# Patient Record
Sex: Male | Born: 1995 | Race: White | Hispanic: No | Marital: Single | Smoking: Never smoker
Health system: Southern US, Community
[De-identification: ages and names within clinical notes are randomized; demographics above are authoritative.]

## PROBLEM LIST (undated history)

## (undated) DIAGNOSIS — K219 Gastro-esophageal reflux disease without esophagitis: Secondary | ICD-10-CM

## (undated) DIAGNOSIS — F32A Depression, unspecified: Secondary | ICD-10-CM

## (undated) DIAGNOSIS — T7840XA Allergy, unspecified, initial encounter: Secondary | ICD-10-CM

## (undated) DIAGNOSIS — F419 Anxiety disorder, unspecified: Secondary | ICD-10-CM

## (undated) DIAGNOSIS — R6250 Unspecified lack of expected normal physiological development in childhood: Secondary | ICD-10-CM

## (undated) HISTORY — DX: Depression, unspecified: F32.A

## (undated) HISTORY — DX: Allergy, unspecified, initial encounter: T78.40XA

## (undated) HISTORY — DX: Anxiety disorder, unspecified: F41.9

---
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
## 2018-08-31 ENCOUNTER — Other Ambulatory Visit: Payer: Self-pay

## 2018-08-31 ENCOUNTER — Encounter (HOSPITAL_COMMUNITY): Payer: Self-pay | Admitting: Emergency Medicine

## 2018-08-31 ENCOUNTER — Emergency Department (HOSPITAL_COMMUNITY): Payer: No Typology Code available for payment source

## 2018-08-31 ENCOUNTER — Emergency Department (HOSPITAL_COMMUNITY)
Admission: EM | Admit: 2018-08-31 | Discharge: 2018-08-31 | Disposition: A | Discharge status: Home / Self Care | Payer: No Typology Code available for payment source | Attending: Emergency Medicine | Admitting: Emergency Medicine

## 2018-08-31 DIAGNOSIS — Y939 Activity, unspecified: Secondary | ICD-10-CM | POA: Insufficient documentation

## 2018-08-31 DIAGNOSIS — T148XXA Other injury of unspecified body region, initial encounter: Secondary | ICD-10-CM | POA: Diagnosis not present

## 2018-08-31 DIAGNOSIS — Y92410 Unspecified street and highway as the place of occurrence of the external cause: Secondary | ICD-10-CM | POA: Diagnosis not present

## 2018-08-31 DIAGNOSIS — Z79899 Other long term (current) drug therapy: Secondary | ICD-10-CM | POA: Insufficient documentation

## 2018-08-31 DIAGNOSIS — T07XXXA Unspecified multiple injuries, initial encounter: Secondary | ICD-10-CM

## 2018-08-31 DIAGNOSIS — S199XXA Unspecified injury of neck, initial encounter: Secondary | ICD-10-CM | POA: Diagnosis present

## 2018-08-31 DIAGNOSIS — S161XXA Strain of muscle, fascia and tendon at neck level, initial encounter: Secondary | ICD-10-CM | POA: Insufficient documentation

## 2018-08-31 DIAGNOSIS — Y999 Unspecified external cause status: Secondary | ICD-10-CM | POA: Insufficient documentation

## 2018-08-31 HISTORY — DX: Gastro-esophageal reflux disease without esophagitis: K21.9

## 2018-08-31 MED ORDER — CYCLOBENZAPRINE HCL 10 MG PO TABS: 10.0000 mg | ORAL_TABLET | Freq: Three times a day (TID) | ORAL | 0 refills | Status: DC

## 2018-08-31 MED ORDER — TRAMADOL HCL 50 MG PO TABS: ORAL_TABLET | ORAL | 0 refills | Status: DC

## 2018-08-31 MED ORDER — ONDANSETRON HCL 4 MG PO TABS
4.0000 mg | ORAL_TABLET | Freq: Once | ORAL | Status: AC
  Administered 2018-08-31: 4 mg via ORAL
  Filled 2018-08-31: qty 1

## 2018-08-31 MED ORDER — TRAMADOL HCL 50 MG PO TABS
100.0000 mg | ORAL_TABLET | Freq: Once | ORAL | Status: AC
  Administered 2018-08-31: 100 mg via ORAL
  Filled 2018-08-31: qty 2

## 2018-08-31 MED ORDER — METHOCARBAMOL 500 MG PO TABS
500.0000 mg | ORAL_TABLET | Freq: Once | ORAL | Status: AC
  Administered 2018-08-31: 500 mg via ORAL
  Filled 2018-08-31: qty 1
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
## 2018-08-31 NOTE — ED Notes (Signed)
Patient transported to CT 

## 2018-08-31 NOTE — Discharge Instructions (Addendum)
Your vital signs are within normal limits.  Your oxygen level is 100% on room air.  The CT scan of your neck is negative for fracture or dislocation.  The x-rays of your shoulder and knee are negative for fracture, dislocation, or effusion/fluid in the joint.  Please use Tylenol every 4 hours, or ibuprofen every 6 hours for pain and discomfort.  Use Flexeril 3 times daily for spasm pain.  Use Ultram for more severe pain, or pain not improved by Tylenol or ibuprofen.  Flexeril and Ultram may cause drowsiness, please do not drive a vehicle, operate machinery, drink alcohol, and legal documents, or participate in activities requiring concentration when taking either of these medications.  Please see Dr. Ermalinda MemosBradshaw for additional evaluation and management if not improving.

## 2018-10-13 ENCOUNTER — Ambulatory Visit (INDEPENDENT_AMBULATORY_CARE_PROVIDER_SITE_OTHER): Payer: Self-pay | Admitting: Family Medicine

## 2018-10-13 ENCOUNTER — Encounter: Payer: Self-pay | Admitting: Family Medicine

## 2018-10-13 VITALS — BP 133/83 | HR 95 | Temp 97.8°F | Ht 73.0 in | Wt 187.6 lb

## 2018-10-13 DIAGNOSIS — S134XXA Sprain of ligaments of cervical spine, initial encounter: Secondary | ICD-10-CM

## 2018-10-13 DIAGNOSIS — S46911A Strain of unspecified muscle, fascia and tendon at shoulder and upper arm level, right arm, initial encounter: Secondary | ICD-10-CM
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
## 2018-10-21 ENCOUNTER — Ambulatory Visit: Payer: Self-pay | Attending: Family Medicine | Admitting: Physical Therapy

## 2018-10-21 DIAGNOSIS — M25511 Pain in right shoulder: Secondary | ICD-10-CM | POA: Insufficient documentation

## 2018-10-21 DIAGNOSIS — R293 Abnormal posture: Secondary | ICD-10-CM | POA: Insufficient documentation
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
## 2018-10-25 ENCOUNTER — Ambulatory Visit: Payer: Self-pay | Admitting: Physical Therapy

## 2018-10-25 ENCOUNTER — Encounter: Payer: Self-pay | Admitting: Physical Therapy

## 2018-10-25 DIAGNOSIS — M25511 Pain in right shoulder: Secondary | ICD-10-CM

## 2018-10-25 DIAGNOSIS — R293 Abnormal posture: Secondary | ICD-10-CM

## 2018-10-25 NOTE — Therapy (Signed)
[facility] [address] [place], [place], [address] Phone: [phone]   Fax:  [phone]  Physical Therapy Treatment  Patient Details  Name: Joshua Hurst MRN: [id number] Date of Birth: 02-27-1996 Referring Provider (PT): Arville Care MD.   Encounter Date: 10/25/2018  PT End of Session - 10/25/18 1120    Visit Number  2    Number of Visits  12    Date for PT Re-Evaluation  12/02/18    PT Start Time  1120    PT Stop Time  1205    PT Time Calculation (min)  45 min    Activity Tolerance  Patient tolerated treatment well    Behavior During Therapy  [facility] for tasks assessed/performed       Past Medical History:  Diagnosis Date  . Acid reflux   . Allergy    Spring allergies    History reviewed. No pertinent surgical history.  There were no vitals filed for this visit.  Subjective Assessment - 10/25/18 1119    Subjective  Reports continued pain.    Patient Stated Goals  Get out of pain.    Currently in Pain?  Yes    Pain Score  8     Pain Location  Shoulder    Pain Orientation  Right    Pain Descriptors / Indicators  Aching    Pain Type  Acute pain    Pain Onset  More than a month ago    Pain Frequency  Constant         [facility] PT Assessment - 10/25/18 0001      Assessment   Medical Diagnosis  Right shoulder strain.    Referring Provider (PT)  Ivin Booty Dettinger MD.    Onset Date/Surgical Date  09/01/18    Next MD Visit  Unsure      Precautions   Precautions  None      Restrictions   Weight Bearing Restrictions  No                   [facility] Adult PT Treatment/Exercise - 10/25/18 0001      Modalities   Modalities  Electrical Stimulation;Ultrasound;Vasopneumatic      Electrical Stimulation   Electrical Stimulation Location  R shoulder    Electrical Stimulation Action  Pre-Mod    Electrical Stimulation Parameters  80-150 hz x15 min    Electrical Stimulation Goals  Pain      Ultrasound    Ultrasound Location  R shoulder    Ultrasound Parameters  Combo 1.2 w/cm2, 100%, 1 mhz x10 min    Ultrasound Goals  Pain      Vasopneumatic   Number Minutes Vasopneumatic   15 minutes    Vasopnuematic Location   Shoulder    Vasopneumatic Pressure  Low    Vasopneumatic Temperature   51      Manual Therapy   Manual Therapy  Myofascial release;Passive ROM    Myofascial Release  IASTW to R proximal bicep, deltoids to reduce adhesions, muscle restrictions    Passive ROM  PROM of R shoulder into flex, abduction, ER, IR with gentle holds at end range                  PT Long Term Goals - 10/25/18 1241      PT LONG TERM GOAL #1   Title  Independent with a HEP.    Time  6    Period  Weeks  Status  New      PT LONG TERM GOAL #2   Title  Perform ADL's with pain not > 2/10.    Time  6    Period  Weeks    Status  New            Plan - 10/25/18 1203    Clinical Impression Statement  Patient presented in clinic with increased R shoulder pain that is reported as constant in both posterior and anterior shoulder. Normal modalities response noted following end of the modalities session. IASTW completed to R shoulder lightly with good redness response. PROM of R shoulder completed as well with greater resistance into IR and ER. Patient reported pain in B hands during PROM session. VCs required throughout PROM session for relaxation of RUE. Patient instructed that following IASTW he may be very sore and that ice could be utilized for 15-20 minutes at a time to assist with the discomfort.     Rehab Potential  Excellent    PT Frequency  2x / week    PT Duration  6 weeks    PT Treatment/Interventions  ADLs/Self Care Home Management;Cryotherapy;Electrical Stimulation;Ultrasound;Moist Heat;Therapeutic activities;Therapeutic exercise;Patient/family education;Manual techniques;Vasopneumatic Device    PT Next Visit Plan  Combo e'stim/U/S to right anterior shoulder, HMP, e'stim and  vasopneumatic.    Consulted and Agree with Plan of Care  Patient       Patient will benefit from skilled therapeutic intervention in order to improve the following deficits and impairments:  Pain  Visit Diagnosis: Acute pain of right shoulder  Abnormal posture     Problem List Patient Active Problem List   Diagnosis Date Noted  . Paronychia of great toe, right 02/19/2013  . Nail, ingrown 02/19/2013    APPLEGATE, [place], PTA 10/25/2018, 12:42 PM  [facility] [address] [place], [place], [address] Phone: [phone]   Fax:  [phone]  Name: Joshua Hurst MRN: [id number] Date of Birth: April 02, 1996

## 2018-10-27 ENCOUNTER — Ambulatory Visit: Payer: Self-pay | Admitting: Physical Therapy

## 2018-10-27 ENCOUNTER — Encounter: Payer: Self-pay | Admitting: Physical Therapy

## 2018-10-27 DIAGNOSIS — M25511 Pain in right shoulder: Secondary | ICD-10-CM

## 2018-10-27 DIAGNOSIS — R293 Abnormal posture: Secondary | ICD-10-CM

## 2018-10-27 NOTE — Therapy (Signed)
[facility] [address] [place], [place], [address] Phone: [phone]   Fax:  [phone]  Physical Therapy Treatment  Patient Details  Name: Joshua Hurst MRN: [id number] Date of Birth: 07-17-1996 Referring Provider (PT): Arville Care MD.   Encounter Date: 10/27/2018  PT End of Session - 10/27/18 1154    Visit Number  3    Number of Visits  12    Date for PT Re-Evaluation  12/02/18    PT Start Time  1115    PT Stop Time  1205    PT Time Calculation (min)  50 min    Activity Tolerance  Patient tolerated treatment well    Behavior During Therapy  [facility] for tasks assessed/performed       Past Medical History:  Diagnosis Date  . Acid reflux   . Allergy    Spring allergies    History reviewed. No pertinent surgical history.  There were no vitals filed for this visit.      [facility] PT Assessment - 10/27/18 0001      Assessment   Medical Diagnosis  Right shoulder strain.    Referring Provider (PT)  Ivin Booty Dettinger MD.    Onset Date/Surgical Date  09/01/18                   [facility] Adult PT Treatment/Exercise - 10/27/18 0001      Modalities   Modalities  Electrical Stimulation;Ultrasound;Vasopneumatic      Electrical Stimulation   Electrical Stimulation Location  R shoulder    Electrical Stimulation Action  Pre-mod    Electrical Stimulation Parameters  80-150 hz x15 min    Electrical Stimulation Goals  Pain      Ultrasound   Ultrasound Location  R shoulder    Ultrasound Parameters  combo 1.2 w/cm2, 100%, x12 mins    Ultrasound Goals  Pain      Vasopneumatic   Number Minutes Vasopneumatic   15 minutes    Vasopnuematic Location   Shoulder    Vasopneumatic Pressure  Low    Vasopneumatic Temperature   54      Manual Therapy   Manual Therapy  Myofascial release;Passive ROM;Joint mobilization    Joint Mobilization  Gentle grade I Posterior mobs    Myofascial Release  IASTW to R proximal bicep,  deltoids to reduce adhesions, muscle restrictions    Passive ROM  PROM of R shoulder into flex, abduction, ER, IR with gentle holds at end range                  PT Long Term Goals - 10/25/18 1241      PT LONG TERM GOAL #1   Title  Independent with a HEP.    Time  6    Period  Weeks    Status  New      PT LONG TERM GOAL #2   Title  Perform ADL's with pain not > 2/10.    Time  6    Period  Weeks    Status  New            Plan - 10/27/18 1154    Clinical Impression Statement  Patient was able to tolerate treatment fairly well but did report right hand/wrist numbness during PROM. Patient very guarded with PROM despite asking to relax arm and allow therapist to move it without help and with oscillations to promote muscle relaxation. Patient noted with rounded shoulders and forward head in sitting.  Normal response to modalities upon removal.     Clinical Presentation  Stable    Clinical Decision Making  Low    Rehab Potential  Excellent    PT Frequency  2x / week    PT Duration  6 weeks    PT Treatment/Interventions  ADLs/Self Care Home Management;Cryotherapy;Electrical Stimulation;Ultrasound;Moist Heat;Therapeutic activities;Therapeutic exercise;Patient/family education;Manual techniques;Vasopneumatic Device    PT Next Visit Plan  Combo e'stim/U/S to right anterior shoulder, HMP, e'stim and vasopneumatic.    Consulted and Agree with Plan of Care  Patient       Patient will benefit from skilled therapeutic intervention in order to improve the following deficits and impairments:  Pain  Visit Diagnosis: Acute pain of right shoulder  Abnormal posture     Problem List Patient Active Problem List   Diagnosis Date Noted  . Paronychia of great toe, right 02/19/2013  . Nail, ingrown 02/19/2013    Guss BundeKrystle Christiano Blandon, PT, DPT 10/27/2018, 12:07 PM  [facility] [address] [place], [place], [address] Phone: [phone]    Fax:  [phone]  Name: Joshua Hurst MRN: [id number] Date of Birth: December 21, 1995

## 2018-11-01 ENCOUNTER — Ambulatory Visit: Payer: Self-pay | Admitting: Physical Therapy

## 2018-11-01 DIAGNOSIS — R293 Abnormal posture: Secondary | ICD-10-CM

## 2018-11-01 DIAGNOSIS — M25511 Pain in right shoulder: Secondary | ICD-10-CM

## 2018-11-01 NOTE — Therapy (Signed)
[facility] [address] [place], [place], [address] Phone: [phone]   Fax:  [phone]  Physical Therapy Treatment  Patient Details  Name: Joshua Hurst MRN: [id number] Date of Birth: [birth year] Referring Provider (PT): Arville Care MD.   Encounter Date: 11/01/2018  PT End of Session - 11/01/18 1121    Visit Number  4    Number of Visits  12    Date for PT Re-Evaluation  12/02/18    PT Start Time  1118    PT Stop Time  1203    PT Time Calculation (min)  45 min    Activity Tolerance  Patient tolerated treatment well    Behavior During Therapy  [facility] for tasks assessed/performed       Past Medical History:  Diagnosis Date  . Acid reflux   . Allergy    Spring allergies    No past surgical history on file.  There were no vitals filed for this visit.  Subjective Assessment - 11/01/18 1121    Subjective  Reports continued pain.    Patient Stated Goals  Get out of pain.    Currently in Pain?  Yes    Pain Score  8     Pain Location  Shoulder    Pain Orientation  Right    Pain Descriptors / Indicators  Discomfort    Pain Type  Acute pain    Pain Onset  More than a month ago         [facility] PT Assessment - 11/01/18 0001      Assessment   Medical Diagnosis  Right shoulder strain.    Referring Provider (PT)  Ivin Booty Dettinger MD.    Onset Date/Surgical Date  09/01/18                   [facility] Adult PT Treatment/Exercise - 11/01/18 0001      Exercises   Exercises  Shoulder      Shoulder Exercises: Pulleys   Flexion  5 minutes      Shoulder Exercises: ROM/Strengthening   UBE (Upper Arm Bike)  120 RPM x6 min      Modalities   Modalities  Electrical Stimulation;Ultrasound;Vasopneumatic      Electrical Stimulation   Electrical Stimulation Location  R shoulder    Electrical Stimulation Action  Pre-Mod    Electrical Stimulation Parameters  80-150 hz x15 min    Electrical Stimulation Goals  Pain      Ultrasound   Ultrasound Location  R shoulder    Ultrasound Parameters  Combo 1.5 w/cm2, 100%, 1 mhz x10 min    Ultrasound Goals  Pain      Vasopneumatic   Number Minutes Vasopneumatic   15 minutes    Vasopnuematic Location   Shoulder    Vasopneumatic Pressure  Low    Vasopneumatic Temperature   34      Manual Therapy   Manual Therapy  Passive ROM    Passive ROM  PROM of R shoulder into flex, ER, IR with gentle holds at end range                  PT Long Term Goals - 10/25/18 1241      PT LONG TERM GOAL #1   Title  Independent with a HEP.    Time  6    Period  Weeks    Status  New      PT LONG TERM GOAL #2  Title  Perform ADL's with pain not > 2/10.    Time  6    Period  Weeks    Status  New            Plan - 11/01/18 1155    Clinical Impression Statement  Patient presented in clinic with continued reports of high level R shoulder pain particularly in anterior R shoulder. Patient introduced to more AAROM exercises in sitting in which patient completed in slow speed and completed only partial ROM with pulleys. Combo US completed as well with normal response. Full PROM of R shoulder into flexion and ER with muscle guarding noted during flexion and IR. Intermittant popping of R shoulder noted during PROM into flexion in which patient reported with discomfort. Firm end feels and smooth arc of motion noted durin PROM of R shoulder othan than times with muscle guarding in which limited PROM. Normal modalities response noted following removal of the modalities.    Rehab Potential  Excellent    PT Frequency  2x / week    PT Duration  6 weeks    PT Treatment/Interventions  ADLs/Self Care Home Management;Cryotherapy;Electrical Stimulation;Ultrasound;Moist Heat;Therapeutic activities;Therapeutic exercise;Patient/family education;Manual techniques;Vasopneumatic Device    PT Next Visit Plan  Combo e'stim/U/S to right anterior shoulder, HMP, e'stim and vasopneumatic.     Consulted and Agree with Plan of Care  Patient       Patient will benefit from skilled therapeutic intervention in order to improve the following deficits and impairments:  Pain  Visit Diagnosis: Acute pain of right shoulder  Abnormal posture     Problem List Patient Active Problem List   Diagnosis Date Noted  . Paronychia of great toe, right 02/19/2013  . Nail, ingrown 02/19/2013    Marvell FullerKelsey P Quanda Pavlicek, PTA 11/01/2018, 12:05 PM  [facility] [address] [place], [place], [address] Phone: [phone]   Fax:  [phone]  Name: Joshua Hurst MRN: [id number] Date of Birth: [birth year]

## 2018-11-03 ENCOUNTER — Encounter: Payer: Self-pay | Admitting: Physical Therapy

## 2018-11-03 ENCOUNTER — Ambulatory Visit: Payer: Self-pay | Admitting: Physical Therapy

## 2018-11-03 DIAGNOSIS — M25511 Pain in right shoulder: Secondary | ICD-10-CM

## 2018-11-03 DIAGNOSIS — R293 Abnormal posture: Secondary | ICD-10-CM

## 2018-11-03 NOTE — Therapy (Signed)
[facility] [address] [place], [place], [address] Phone: [phone]   Fax:  [phone]  Physical Therapy Treatment  Patient Details  Name: Joshua Hurst MRN: [id number] Date of Birth: [birth year] Referring Provider (PT): Arville Care MD.   Encounter Date: 11/03/2018  PT End of Session - 11/03/18 1121    Visit Number  5    Number of Visits  12    Date for PT Re-Evaluation  12/02/18    PT Start Time  1120    PT Stop Time  1159    PT Time Calculation (min)  39 min    Activity Tolerance  Patient tolerated treatment well    Behavior During Therapy  [facility] for tasks assessed/performed       Past Medical History:  Diagnosis Date  . Acid reflux   . Allergy    Spring allergies    History reviewed. No pertinent surgical history.  There were no vitals filed for this visit.  Subjective Assessment - 11/03/18 1121    Subjective  Reports continued high level pain in R shoulder since starting exercises last treatment.    Patient Stated Goals  Get out of pain.    Currently in Pain?  Yes    Pain Score  9     Pain Location  Shoulder    Pain Orientation  Right    Pain Descriptors / Indicators  Discomfort    Pain Type  Acute pain    Pain Onset  More than a month ago    Pain Frequency  Constant         [facility] PT Assessment - 11/03/18 0001      Assessment   Medical Diagnosis  Right shoulder strain.    Referring Provider (PT)  Ivin Booty Dettinger MD.    Onset Date/Surgical Date  09/01/18    Next MD Visit  Unsure      Precautions   Precautions  None      Restrictions   Weight Bearing Restrictions  No                   [facility] Adult PT Treatment/Exercise - 11/03/18 0001      Shoulder Exercises: ROM/Strengthening   UBE (Upper Arm Bike)  120 RPM x6 min   increased pain     Modalities   Modalities  Electrical Stimulation;Ultrasound;Vasopneumatic      Electrical Stimulation   Electrical Stimulation Location  R  shoulder    Electrical Stimulation Action  Pre-Mod    Electrical Stimulation Parameters  80-150 hz x15 min    Electrical Stimulation Goals  Pain      Ultrasound   Ultrasound Location  R proximal shoulder    Ultrasound Parameters  1.5 w/cm2, 100%, x10 min    Ultrasound Goals  Pain      Vasopneumatic   Number Minutes Vasopneumatic   15 minutes    Vasopnuematic Location   Shoulder    Vasopneumatic Pressure  Low    Vasopneumatic Temperature   52      Manual Therapy   Manual Therapy  Passive ROM    Passive ROM  PROM of R shoulder into flex, ER, IR with gentle holds at end range                  PT Long Term Goals - 10/25/18 1241      PT LONG TERM GOAL #1   Title  Independent with a HEP.  Time  6    Period  Weeks    Status  New      PT LONG TERM GOAL #2   Title  Perform ADL's with pain not > 2/10.    Time  6    Period  Weeks    Status  New            Plan - 11/03/18 1204    Clinical Impression Statement  Patient presented in clinic with continued reports of high level R shoulder pain. Patient completed UBE at very slow pace and observed holding his R shoulder afterwards. Normal modalities response noted following removal of the modalities. Muscle guarding in R deltoids noted. Firm end feels detected in R shoulder flexion or ER. No end feel detected in R shoulder IR due to muscle resistance. One instance of R shoulder popping detected with PROM in supine.    Rehab Potential  Excellent    PT Frequency  2x / week    PT Duration  6 weeks    PT Treatment/Interventions  ADLs/Self Care Home Management;Cryotherapy;Electrical Stimulation;Ultrasound;Moist Heat;Therapeutic activities;Therapeutic exercise;Patient/family education;Manual techniques;Vasopneumatic Device    PT Next Visit Plan  Combo e'stim/U/S to right anterior shoulder, HMP, e'stim and vasopneumatic.    Consulted and Agree with Plan of Care  Patient       Patient will benefit from skilled  therapeutic intervention in order to improve the following deficits and impairments:  Pain  Visit Diagnosis: Acute pain of right shoulder  Abnormal posture     Problem List Patient Active Problem List   Diagnosis Date Noted  . Paronychia of great toe, right 02/19/2013  . Nail, ingrown 02/19/2013    Marvell Fuller, PTA 11/03/2018, 12:10 PM  [facility] [address] [place], [place], [address] Phone: [phone]   Fax:  [phone]  Name: Joshua Hurst MRN: [id number] Date of Birth: 1995-12-12

## 2018-11-08 ENCOUNTER — Encounter: Payer: Self-pay | Admitting: Physical Therapy

## 2018-11-08 ENCOUNTER — Ambulatory Visit: Payer: Self-pay | Attending: Family Medicine | Admitting: Physical Therapy

## 2018-11-08 DIAGNOSIS — M25511 Pain in right shoulder: Secondary | ICD-10-CM | POA: Insufficient documentation

## 2018-11-08 DIAGNOSIS — R293 Abnormal posture: Secondary | ICD-10-CM | POA: Insufficient documentation

## 2018-11-08 NOTE — Therapy (Signed)
[facility] [address] [place], [place], [address] Phone: [phone]   Fax:  [phone]  Physical Therapy Treatment  Patient Details  Name: Joshua Hurst MRN: [id number] Date of Birth: 1996-07-05 Referring Provider (PT): Arville CareJoshua Dettinger MD.   Encounter Date: 11/08/2018  PT End of Session - 11/08/18 1515    Visit Number  6    Number of Visits  12    Date for PT Re-Evaluation  12/02/18    PT Start Time  1430    PT Stop Time  1517    PT Time Calculation (min)  47 min    Activity Tolerance  Patient tolerated treatment well    Behavior During Therapy  [facility]       Past Medical History:  Diagnosis Date  . Acid reflux   . Allergy    Spring allergies    History reviewed. No pertinent surgical history.  There were no vitals filed for this visit.  Subjective Assessment - 11/08/18 1511    Subjective  Patient reported ongoing 8/10 right shoulder pain    Patient Stated Goals  Get out of pain.    Currently in Pain?  Yes    Pain Score  8     Pain Orientation  Right    Pain Descriptors / Indicators  Sore    Pain Type  Acute pain    Pain Onset  More than a month ago    Pain Frequency  Constant         [facility] PT Assessment - 11/08/18 0001      Assessment   Medical Diagnosis  Right shoulder strain.    Referring Provider (PT)  Ivin BootyJoshua Dettinger MD.    Onset Date/Surgical Date  09/01/18    Next MD Visit  Jaci LazierUnsure                   [facility] Adult PT Treatment/Exercise - 11/08/18 0001      Shoulder Exercises: ROM/Strengthening   UBE (Upper Arm Bike)  120 RPM x8 min      Shoulder Exercises: Stretch   Corner Stretch  2 reps;30 seconds    Other Shoulder Stretches  right UT stretch 2x30 seconds      Modalities   Modalities  Music therapistlectrical Stimulation;Ultrasound;Vasopneumatic      Electrical Stimulation   Electrical Stimulation Location  R shoulder    Electrical Stimulation Action  pre-mod    Electrical Stimulation Parameters  80-150 hz x15 mins    Electrical Stimulation Goals  Pain      Ultrasound   Ultrasound Location  R UT    Ultrasound Parameters  1.5w/cm2, 100%, 1mhz x12 mins    Ultrasound Goals  Pain      Vasopneumatic   Number Minutes Vasopneumatic   15 minutes    Vasopnuematic Location   Shoulder    Vasopneumatic Pressure  Low    Vasopneumatic Temperature   34                  PT Long Term Goals - 10/25/18 1241      PT LONG TERM GOAL #1   Title  Independent with a HEP.    Time  6    Period  Weeks    Status  New      PT LONG TERM GOAL #2   Title  Perform ADL's with pain not > 2/10.    Time  6    Period  Weeks  Status  New            Plan - 11/08/18 1515    Clinical Impression Statement  Patient was able to complete but patient noted with performing UBE at a very slow pace. Patient reported of complaints of left shoulder pain as well after UBE. Patient was able to perform UT stretch and corner stretch with minimal complaints. When applying e-stim patient reported feeling the e-stim and stated intensity was strong enough but when rechecking modalities, it was noted that e-stim was applied to the wrong channel. Patient asked again if he felt sensation and he replied no. E-stim was readjusted to which patient reported feeling sensation.    Clinical Presentation  Stable    Clinical Decision Making  Low    Rehab Potential  Excellent    PT Frequency  2x / week    PT Duration  6 weeks    PT Treatment/Interventions  ADLs/Self Care Home Management;Cryotherapy;Electrical Stimulation;Ultrasound;Moist Heat;Therapeutic activities;Therapeutic exercise;Patient/family education;Manual techniques;Vasopneumatic Device    PT Next Visit Plan  Combo e'stim/U/S to right anterior shoulder, HMP, e'stim and vasopneumatic.    Consulted and Agree with Plan of Care  Patient       Patient will benefit from skilled therapeutic intervention in order to improve the  following deficits and impairments:  Pain  Visit Diagnosis: Acute pain of right shoulder  Abnormal posture     Problem List Patient Active Problem List   Diagnosis Date Noted  . Paronychia of great toe, right 02/19/2013  . Nail, ingrown 02/19/2013    Guss BundeKrystle Dejanee Thibeaux, PT, DPT 11/08/2018, 3:29 PM  [facility] [address] [place], [place], [address] Phone: [phone]   Fax:  [phone]  Name: Joshua Hurst MRN: [id number] Date of Birth: 28-Aug-1996

## 2018-11-11 ENCOUNTER — Ambulatory Visit: Payer: Self-pay | Admitting: Physical Therapy

## 2018-11-11 ENCOUNTER — Encounter: Payer: Self-pay | Admitting: Physical Therapy

## 2018-11-11 DIAGNOSIS — R293 Abnormal posture: Secondary | ICD-10-CM

## 2018-11-11 DIAGNOSIS — M25511 Pain in right shoulder: Secondary | ICD-10-CM

## 2018-11-11 NOTE — Therapy (Signed)
[facility] [place], [place], [address] Phone: [phone]   Fax:  [phone]  Physical Therapy Treatment  Patient Details  Name: Joshua Hurst MRN: [id number] Date of Birth: [birth year] Referring Provider (PT): Caryl Pina MD.   Encounter Date: 11/11/2018  PT End of Session - 11/11/18 1118    Visit Number  7    Number of Visits  12    Date for PT Re-Evaluation  12/02/18    PT Start Time  1116    PT Stop Time  1201    PT Time Calculation (min)  45 min    Activity Tolerance  Patient tolerated treatment well    Behavior During Therapy  [facility] for tasks assessed/performed       Past Medical History:  Diagnosis Date  . Acid reflux   . Allergy    Spring allergies    History reviewed. No pertinent surgical history.  There were no vitals filed for this visit.  Subjective Assessment - 11/11/18 1118    Subjective  Patient reported ongoing 8/10 right shoulder pain    Patient Stated Goals  Get out of pain.    Currently in Pain?  Yes    Pain Score  8     Pain Location  Shoulder    Pain Orientation  Right    Pain Descriptors / Indicators  Discomfort    Pain Type  Acute pain    Pain Onset  More than a month ago         [facility] PT Assessment - 11/11/18 0001      Assessment   Medical Diagnosis  Right shoulder strain.    Referring Provider (PT)  Vonna Kotyk Dettinger MD.    Onset Date/Surgical Date  09/01/18    Next MD Visit  Unsure      ROM / Strength   AROM / PROM / Strength  AROM;PROM      AROM   Overall AROM   Deficits    AROM Assessment Site  Shoulder    Right/Left Shoulder  Right    Right Shoulder Flexion  118 Degrees    Right Shoulder Internal Rotation  60 Degrees    Right Shoulder External Rotation  58 Degrees      PROM   Overall PROM   Deficits    PROM Assessment Site  Shoulder    Right/Left Shoulder  Right    Right Shoulder Flexion  122 Degrees    Right Shoulder Internal Rotation  58 Degrees    Right Shoulder External Rotation  58 Degrees                   [facility] Adult PT Treatment/Exercise - 11/11/18 0001      Shoulder Exercises: Seated   Other Seated Exercises  UE ranger into flex x3 min, circles x3 min      Shoulder Exercises: Pulleys   Flexion  3 minutes      Modalities   Modalities  Electrical Stimulation;Ultrasound;Vasopneumatic      Electrical Stimulation   Electrical Stimulation Location  R shoulder    Electrical Stimulation Action  Pre-Mod    Electrical Stimulation Parameters  80-150 hz x10 min    Electrical Stimulation Goals  Pain      Ultrasound   Ultrasound Location  R UT    Ultrasound Parameters  1.5 w/cm2, 100%, 1 mhz x10 min    Ultrasound Goals  Pain      Vasopneumatic   Number  Minutes Vasopneumatic   10 minutes    Vasopnuematic Location   Shoulder    Vasopneumatic Pressure  Low    Vasopneumatic Temperature   34                  PT Long Term Goals - 11/11/18 1210      PT LONG TERM GOAL #1   Title  Independent with a HEP.    Time  6    Period  Weeks    Status  Unable to assess      PT LONG TERM GOAL #2   Title  Perform ADL's with pain not > 2/10.    Time  6    Period  Weeks    Status  Not Met            Plan - 11/11/18 1157    Clinical Impression Statement  Patient presented in clinic with continued reports of 8/10 R shoulder. Patient advised that since PT has not been improving pain and symptoms to return to MD for further assessment. Patient indicated more AC joint pain as well as pain into R UT. TIghtness palpable in superior fibers of triangle of the neck as well as posterior fibers. AROM and PROM measurements listed in today note with intermittant muscle resistance noted with shoulder IR PROM and flexion. Normal modalities response noted following removal of the modalities.    Rehab Potential  Excellent    PT Frequency  2x / week    PT Duration  6 weeks    PT Treatment/Interventions  ADLs/Self Care Home  Management;Cryotherapy;Electrical Stimulation;Ultrasound;Moist Heat;Therapeutic activities;Therapeutic exercise;Patient/family education;Manual techniques;Vasopneumatic Device    PT Next Visit Plan  Return to MD for further assessment.    Consulted and Agree with Plan of Care  Patient       Patient will benefit from skilled therapeutic intervention in order to improve the following deficits and impairments:  Pain  Visit Diagnosis: Acute pain of right shoulder  Abnormal posture     Problem List Patient Active Problem List   Diagnosis Date Noted  . Paronychia of great toe, right 02/19/2013  . Nail, ingrown 02/19/2013   Standley Brooking, PTA 11/11/18 12:11 PM   [facility] [place], [place], [address] Phone: [phone]   Fax:  [phone]  Name: Joshua Hurst MRN: [id number] Date of Birth: [birth year]

## 2018-11-17 ENCOUNTER — Encounter: Payer: Medicaid Other | Admitting: Physical Therapy

## 2018-11-19 ENCOUNTER — Encounter: Payer: Self-pay | Admitting: Family Medicine

## 2018-11-19 ENCOUNTER — Ambulatory Visit (INDEPENDENT_AMBULATORY_CARE_PROVIDER_SITE_OTHER): Payer: Self-pay | Admitting: Family Medicine

## 2018-11-19 ENCOUNTER — Encounter: Payer: Medicaid Other | Admitting: Physical Therapy

## 2018-11-19 VITALS — BP 118/76 | HR 68 | Temp 97.8°F | Ht 73.0 in | Wt 187.4 lb

## 2018-11-19 DIAGNOSIS — S46911A Strain of unspecified muscle, fascia and tendon at shoulder and upper arm level, right arm, initial encounter: Secondary | ICD-10-CM

## 2018-11-19 DIAGNOSIS — S46911D Strain of unspecified muscle, fascia and tendon at shoulder and upper arm level, right arm, subsequent encounter: Secondary | ICD-10-CM

## 2018-11-19 DIAGNOSIS — M25511 Pain in right shoulder: Secondary | ICD-10-CM
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
## 2018-11-23 ENCOUNTER — Encounter: Payer: Self-pay | Admitting: Physical Therapy

## 2018-11-23 ENCOUNTER — Ambulatory Visit: Payer: Self-pay | Admitting: Physical Therapy

## 2018-11-23 DIAGNOSIS — R293 Abnormal posture: Secondary | ICD-10-CM

## 2018-11-23 DIAGNOSIS — M25511 Pain in right shoulder: Secondary | ICD-10-CM

## 2018-11-23 NOTE — Therapy (Signed)
[facility] [place], [place], [address] Phone: [phone]   Fax:  [phone]  Physical Therapy Treatment  Patient Details  Name: Joshua Hurst MRN: [id number] Date of Birth: [birth year] Referring Provider (PT): Caryl Pina MD.   Encounter Date: 11/23/2018  PT End of Session - 11/23/18 1124    Visit Number  8    Number of Visits  12    Date for PT Re-Evaluation  12/02/18    PT Start Time  1116    PT Stop Time  1204    PT Time Calculation (min)  48 min    Activity Tolerance  Patient limited by pain    Behavior During Therapy  [facility] for tasks assessed/performed       Past Medical History:  Diagnosis Date  . Acid reflux   . Allergy    Spring allergies    History reviewed. No pertinent surgical history.  There were no vitals filed for this visit.  Subjective Assessment - 11/23/18 1120    Subjective  Patient reported "my whole body is numb" which began last week. States doctor requested him to return to PT until MRI.    Patient Stated Goals  Get out of pain.    Currently in Pain?  Yes    Pain Score  8     Pain Location  Shoulder    Pain Orientation  Right    Pain Descriptors / Indicators  Discomfort    Pain Type  Acute pain    Pain Onset  More than a month ago    Pain Frequency  Constant         [facility] PT Assessment - 11/23/18 0001      Assessment   Medical Diagnosis  Right shoulder strain.    Referring Provider (PT)  Vonna Kotyk Dettinger MD.    Onset Date/Surgical Date  09/01/18    Next MD Visit  Unsure waiting for call to schedule MRI      PROM   Right Shoulder Flexion  78 Degrees                   [facility] Adult PT Treatment/Exercise - 11/23/18 0001      Shoulder Exercises: Seated   Other Seated Exercises  UE ranger into flex x3 min, circles x3 min      Shoulder Exercises: Pulleys   Flexion  5 minutes      Electrical Stimulation   Electrical Stimulation Location  R shoulder    Electrical Stimulation Action  pre-mod    Electrical Stimulation Parameters  80-150 hz x10 mins    Electrical Stimulation Goals  Pain      Ultrasound   Ultrasound Location  R UT     Ultrasound Parameters  1.5 w/cm 2, 100% 73mz, x8    Ultrasound Goals  Pain      Vasopneumatic   Number Minutes Vasopneumatic   10 minutes    Vasopnuematic Location   Shoulder    Vasopneumatic Pressure  Low    Vasopneumatic Temperature   34      Manual Therapy   Manual Therapy  Passive ROM    Passive ROM  PROM of R shoulder into flex with holds at end range and oscillations to promote muscle relaxation                  PT Long Term Goals - 11/11/18 1210      PT LONG TERM GOAL #1  Title  Independent with a HEP.    Time  6    Period  Weeks    Status  Unable to assess      PT LONG TERM GOAL #2   Title  Perform ADL's with pain not > 2/10.    Time  6    Period  Weeks    Status  Not Met            Plan - 11/23/18 1125    Clinical Impression Statement  Per MD, patient to continue PT until MRI. Patient noted with increased pain with all activities and instructed to rest as needed throughout session. Patient noted with increased pain with PROM and was only able to reach 78 degrees of passive R shoulder flexion before muscle guarding. PT was unable to attain more range without resistance despite last measurement of right PROM shoulder flexion as 122 degrees. No adverse affects noted upon completion of modalities. Patient instructed to update [place] when he gets his MRI scheduled. Patient reported understanding.    Clinical Presentation  Stable    Clinical Decision Making  Low    Rehab Potential  Excellent    PT Frequency  2x / week    PT Duration  6 weeks    PT Treatment/Interventions  ADLs/Self Care Home Management;Cryotherapy;Electrical Stimulation;Ultrasound;Moist Heat;Therapeutic activities;Therapeutic exercise;Patient/family education;Manual techniques;Vasopneumatic Device    PT Next  Visit Plan  Cont with PROM, AAROM and modalities for pain relief until MRI.    Consulted and Agree with Plan of Care  Patient       Patient will benefit from skilled therapeutic intervention in order to improve the following deficits and impairments:  Pain  Visit Diagnosis: Acute pain of right shoulder  Abnormal posture     Problem List Patient Active Problem List   Diagnosis Date Noted  . Paronychia of great toe, right 02/19/2013  . Nail, ingrown 02/19/2013   Gabriela Eves, PT, DPT 11/23/2018, 12:15 PM  [facility] [address] [place], [place], [address] Phone: [phone]   Fax:  [phone]  Name: Joshua Hurst MRN: [id number] Date of Birth: [birth year]

## 2018-11-25 ENCOUNTER — Ambulatory Visit: Payer: Self-pay | Admitting: *Deleted

## 2018-11-25 DIAGNOSIS — M25511 Pain in right shoulder: Secondary | ICD-10-CM

## 2018-11-25 DIAGNOSIS — R293 Abnormal posture: Secondary | ICD-10-CM

## 2018-11-25 NOTE — Therapy (Signed)
[facility] [place], [place], [address] Phone: [phone]   Fax:  [phone]  Physical Therapy Treatment  Patient Details  Name: Joshua Hurst MRN: [id number] Date of Birth: [birth year] Referring Provider (PT): Caryl Pina MD.   Encounter Date: 11/25/2018  PT End of Session - 11/25/18 1129    Visit Number  9    Number of Visits  12    Date for PT Re-Evaluation  12/02/18    PT Start Time  1115    PT Stop Time  1206    PT Time Calculation (min)  51 min       Past Medical History:  Diagnosis Date  . Acid reflux   . Allergy    Spring allergies    No past surgical history on file.  There were no vitals filed for this visit.  Subjective Assessment - 11/25/18 1128    Subjective  RT shldr 8/10 still    Patient Stated Goals  Get out of pain.    Currently in Pain?  Yes    Pain Score  8     Pain Location  Shoulder    Pain Orientation  Right    Pain Descriptors / Indicators  Discomfort    Pain Type  Acute pain    Pain Onset  More than a month ago    Pain Frequency  Constant                       [facility] Adult PT Treatment/Exercise - 11/25/18 0001      Shoulder Exercises: Seated   Other Seated Exercises  UE ranger into flex x3 min, circles x3 min      Shoulder Exercises: Pulleys   Flexion  5 minutes      Modalities   Modalities  Electrical Stimulation;Ultrasound;Vasopneumatic      Moist Heat Therapy   Number Minutes Moist Heat  15 Minutes    Moist Heat Location  Shoulder      Electrical Stimulation   Electrical Stimulation Location  R shoulder  premod 80-_0  x15 mins    Electrical Stimulation Goals  Pain      Manual Therapy   Manual Therapy  Passive ROM    Passive ROM  PROM of R shoulder into flex with holds at end range and oscillations to promote muscle relaxation                  PT Long Term Goals - 11/11/18 1210      PT LONG TERM GOAL #1   Title  Independent  with a HEP.    Time  6    Period  Weeks    Status  Unable to assess      PT LONG TERM GOAL #2   Title  Perform ADL's with pain not > 2/10.    Time  6    Period  Weeks    Status  Not Met            Plan - 11/25/18 1204    Clinical Impression Statement  Pt arrived today doing about the same with RT shldr pain. He was able to perform AAROM very slow today due to pain as per Pt. He was able to move it better with AROM at times when moving a  pillow and reaching for the bell. Very guarded with PROM and needs V/Cs to relax as well as oscillations.. Heat with Estim today instead of vaso  and did well with normal response. Pt does report increased RT shldr numbness after PROM for elevation.    Clinical Presentation  Stable    Clinical Decision Making  Low    Rehab Potential  Excellent    PT Frequency  2x / week    PT Duration  6 weeks    PT Treatment/Interventions  ADLs/Self Care Home Management;Cryotherapy;Electrical Stimulation;Ultrasound;Moist Heat;Therapeutic activities;Therapeutic exercise;Patient/family education;Manual techniques;Vasopneumatic Device    PT Next Visit Plan  Cont with PROM, AAROM and modalities for pain relief until MRI.    Consulted and Agree with Plan of Care  Patient       Patient will benefit from skilled therapeutic intervention in order to improve the following deficits and impairments:  Pain  Visit Diagnosis: Acute pain of right shoulder  Abnormal posture     Problem List Patient Active Problem List   Diagnosis Date Noted  . Paronychia of great toe, right 02/19/2013  . Nail, ingrown 02/19/2013    Caillou Minus,CHRIS, PTA 11/25/2018, 3:30 PM  [facility] [address] [place], [place], [address] Phone: [phone]   Fax:  [phone]  Name: Joshua Hurst MRN: [id number] Date of Birth: 06/17/1996

## 2018-11-30 ENCOUNTER — Encounter: Payer: Self-pay | Admitting: Physical Therapy

## 2018-11-30 ENCOUNTER — Ambulatory Visit: Payer: Self-pay | Admitting: Physical Therapy

## 2018-11-30 DIAGNOSIS — M25511 Pain in right shoulder: Secondary | ICD-10-CM

## 2018-11-30 DIAGNOSIS — R293 Abnormal posture: Secondary | ICD-10-CM

## 2018-11-30 NOTE — Therapy (Signed)
[facility] [place], [place], [address] Phone: [phone]   Fax:  [phone]  Physical Therapy Treatment  Patient Details  Name: Joshua Hurst MRN: [id number] Date of Birth: 07-Dec-1995 Referring Provider (PT): Caryl Pina MD.   Encounter Date: 11/30/2018  PT End of Session - 11/30/18 1211    Visit Number  10    Number of Visits  12    Date for PT Re-Evaluation  12/02/18    PT Start Time  1115    PT Stop Time  1158    PT Time Calculation (min)  43 min    Activity Tolerance  Patient limited by pain    Behavior During Therapy  [facility] for tasks assessed/performed       Past Medical History:  Diagnosis Date  . Acid reflux   . Allergy    Spring allergies    History reviewed. No pertinent surgical history.  There were no vitals filed for this visit.  Subjective Assessment - 11/30/18 1211    Subjective  Pain an 8/10.  Patient getting an MRI tomorrow.    Patient Stated Goals  Get out of pain.    Currently in Pain?  Yes    Pain Score  8     Pain Location  Shoulder    Pain Orientation  Right    Pain Descriptors / Indicators  Discomfort    Pain Type  Acute pain    Pain Onset  More than a month ago                       [facility] Adult PT Treatment/Exercise - 11/30/18 0001      Exercises   Exercises  Shoulder      Shoulder Exercises: Pulleys   Flexion Limitations  --   5 minutes.   Other Pulley Exercises  UE Ranger on wall x 6 minutes.    Other Pulley Exercises  Ball on table rolls with right UE x 3 minutes.      Shoulder Exercises: ROM/Strengthening   UBE (Upper Arm Bike)  120 RPM's x 10 minutes.      Modalities   Modalities  Electrical Stimulation;Moist Heat      Moist Heat Therapy   Number Minutes Moist Heat  15 Minutes    Moist Heat Location  --   Right shoulder.     Acupuncturist Location  Right shoulder.    Electrical Stimulation Action   Pre-mod.    Electrical Stimulation Parameters  80-150 Hz x 15 minutes.    Electrical Stimulation Goals  Pain                  PT Long Term Goals - 11/11/18 1210      PT LONG TERM GOAL #1   Title  Independent with a HEP.    Time  6    Period  Weeks    Status  Unable to assess      PT LONG TERM GOAL #2   Title  Perform ADL's with pain not > 2/10.    Time  6    Period  Weeks    Status  Not Met            Plan - 11/30/18 1215    Clinical Impression Statement  See "Therapy Note" section.    PT Treatment/Interventions  ADLs/Self Care Home Management;Cryotherapy;Electrical Stimulation;Ultrasound;Moist Heat;Therapeutic activities;Therapeutic exercise;Patient/family education;Manual techniques;Vasopneumatic Device  PT Next Visit Plan  Cont with PROM, AAROM and modalities for pain relief until MRI.    Consulted and Agree with Plan of Care  Patient       Patient will benefit from skilled therapeutic intervention in order to improve the following deficits and impairments:     Visit Diagnosis: Abnormal posture  Acute pain of right shoulder     Problem List Patient Active Problem List   Diagnosis Date Noted  . Paronychia of great toe, right 02/19/2013  . Nail, ingrown 02/19/2013   Progress Note Reporting Period 10/21/18 to 11/30/18  See note below for Objective Data and Assessment of Progress/Goals. Patient continues to reports high right shoulder pain-levels.  No goals met.  Patient receiving an MRI tomorrow.  The patient has two remaining visits.      Shaneque Merkle, [place] MPT 11/30/2018, 12:19 PM  [facility] [address] [place], [place], [address] Phone: [phone]   Fax:  [phone]  Name: Joshua Hurst MRN: [id number] Date of Birth: 21-Dec-1995

## 2018-12-01 ENCOUNTER — Ambulatory Visit (HOSPITAL_COMMUNITY): Payer: No Typology Code available for payment source

## 2018-12-02 ENCOUNTER — Ambulatory Visit: Payer: Self-pay | Admitting: Physical Therapy

## 2018-12-02 ENCOUNTER — Encounter: Payer: Self-pay | Admitting: Physical Therapy

## 2018-12-02 DIAGNOSIS — R293 Abnormal posture: Secondary | ICD-10-CM

## 2018-12-02 DIAGNOSIS — M25511 Pain in right shoulder: Secondary | ICD-10-CM

## 2018-12-02 NOTE — Therapy (Signed)
[facility] [address] [place], [place], [address] Phone: [phone]   Fax:  [phone]  Physical Therapy Treatment  Patient Details  Name: Joshua Hurst MRN: [id number] Date of Birth: 12/14/1995 Referring Provider (PT): Arville Care MD.   Encounter Date: 12/02/2018  PT End of Session - 12/02/18 1310    Visit Number  11    Number of Visits  12    Date for PT Re-Evaluation  12/02/18    PT Start Time  1302    PT Stop Time  1347    PT Time Calculation (min)  45 min    Activity Tolerance  Patient limited by pain;Patient tolerated treatment well    Behavior During Therapy  [facility] for tasks assessed/performed       Past Medical History:  Diagnosis Date  . Acid reflux   . Allergy    Spring allergies    History reviewed. No pertinent surgical history.  There were no vitals filed for this visit.  Subjective Assessment - 12/02/18 1304    Subjective  Patient got MRI yesterday and waiting for results    Patient Stated Goals  Get out of pain.    Currently in Pain?  Yes    Pain Score  8     Pain Location  Shoulder    Pain Orientation  Right    Pain Descriptors / Indicators  Discomfort    Pain Type  Acute pain    Pain Onset  More than a month ago    Pain Frequency  Constant    Aggravating Factors   movement    Pain Relieving Factors  nothing reported                       [facility] Adult PT Treatment/Exercise - 12/02/18 0001      Shoulder Exercises: Pulleys   Flexion  5 minutes    Other Pulley Exercises  UE Ranger on wall for elevation and circles x50min      Moist Heat Therapy   Number Minutes Moist Heat  15 Minutes    Moist Heat Location  Shoulder      Electrical Stimulation   Electrical Stimulation Location  Right shoulder.    Engineering geologist Parameters  80-150hz  x86min    Electrical Stimulation Goals  Pain      Ultrasound   Ultrasound Location  Rt  shlder    Ultrasound Parameters  1.5w/cm2/50%/13mhz x66min    Ultrasound Goals  Pain      Manual Therapy   Manual Therapy  Passive ROM    Passive ROM  PROM of R shoulder into flex/IR/ER with holds at end range and oscillations to promote muscle relaxation, then rhythmic stabs for IR/ER in scaption and flex/ext at 90 degrees                  PT Long Term Goals - 12/02/18 1337      PT LONG TERM GOAL #1   Title  Independent with a HEP.    Time  6    Period  Weeks    Status  On-going   unable to perform HEP due to pain when he tries to use arm on his own 12/02/18     PT LONG TERM GOAL #2   Title  Perform ADL's with pain not > 2/10.    Time  6    Period  Weeks  Status  On-going   patient continues to report 8/10 pain with movement on his own 12/02/18           Plan - 12/02/18 1339    Clinical Impression Statement  Patient tolerated treatment fair due to reported pain. Patient reported a pain and numbness feeling when he moves his own shoulder. Patient reported less discomfort when performong PROM to shoulder. Today ROM was WNL when moved passively. Patient had some good resistance with rhythmic stabs today. Patient goals ongoing due to reported limitations.     Rehab Potential  Excellent    PT Frequency  2x / week    PT Duration  6 weeks    PT Treatment/Interventions  ADLs/Self Care Home Management;Cryotherapy;Electrical Stimulation;Ultrasound;Moist Heat;Therapeutic activities;Therapeutic exercise;Patient/family education;Manual techniques;Vasopneumatic Device    PT Next Visit Plan  Cont with PROM, AAROM and modalities for pain relief until MRI results    Consulted and Agree with Plan of Care  Patient       Patient will benefit from skilled therapeutic intervention in order to improve the following deficits and impairments:  Pain  Visit Diagnosis: Acute pain of right shoulder  Abnormal posture     Problem List Patient Active Problem List   Diagnosis Date  Noted  . Paronychia of great toe, right 02/19/2013  . Nail, ingrown 02/19/2013    Abdulai Blaylock P, PTA 12/02/2018, 1:59 PM  [facility] [address] [place], [place], [address] Phone: [phone]   Fax:  [phone]  Name: Joshua Hurst MRN: [id number] Date of Birth: June 07, 1996

## 2018-12-03 ENCOUNTER — Telehealth: Payer: Self-pay | Admitting: Family Medicine

## 2018-12-03 NOTE — Telephone Encounter (Signed)
Patient is calling to check on the scheduling of his MRI of his shoulder

## 2018-12-03 NOTE — Telephone Encounter (Signed)
I scheduled this and talked to his Mother  They wanted it at [facility] so I don't know what they are asking

## 2018-12-06 MED ORDER — PREDNISONE 20 MG PO TABS: 40.0000 mg | ORAL_TABLET | Freq: Every day | ORAL | 0 refills | Status: DC

## 2018-12-06 NOTE — Telephone Encounter (Signed)
Sent and grandmother aware

## 2018-12-06 NOTE — Telephone Encounter (Signed)
Please review patient's MRI of shoulder he had performed at [facility] and advise patient of results.

## 2018-12-06 NOTE — Telephone Encounter (Signed)
MRI shows a tendinitis which basically means inflammation around the tendon that strained which is why we just have him doing physical therapy and anti-inflammatories, continue with those and it will continue to improve.

## 2018-12-06 NOTE — Telephone Encounter (Signed)
Please send prednisone 40 mg for 5 days, no refill

## 2018-12-06 NOTE — Telephone Encounter (Signed)
LM for pt to call back to discuss

## 2018-12-06 NOTE — Telephone Encounter (Signed)
Spoke with grandmother - she says all he has is OTC pain meds ( IBU)  This has been going on since 08/2018. Is there anything else he can try? May prednisone, if you think it will help?  Please call Grandmother back at [phone]

## 2018-12-07 ENCOUNTER — Ambulatory Visit: Payer: Self-pay | Attending: Family Medicine | Admitting: Physical Therapy

## 2018-12-07 DIAGNOSIS — R293 Abnormal posture: Secondary | ICD-10-CM | POA: Insufficient documentation

## 2018-12-07 DIAGNOSIS — M25511 Pain in right shoulder: Secondary | ICD-10-CM | POA: Insufficient documentation

## 2018-12-07 NOTE — Therapy (Signed)
[facility] [address] [place], [place], [address] Phone: [phone]   Fax:  [phone]  Physical Therapy Treatment  Patient Details  Name: Joshua Hurst MRN: [id number] Date of Birth: 09-Aug-1996 Referring Provider (PT): Arville Care MD.   Encounter Date: 12/07/2018  PT End of Session - 12/07/18 1117    Visit Number  12    Number of Visits  12    Date for PT Re-Evaluation  12/02/18    PT Start Time  1115    PT Stop Time  1204    PT Time Calculation (min)  49 min    Activity Tolerance  Patient limited by pain;Patient tolerated treatment well    Behavior During Therapy  [facility] for tasks assessed/performed       Past Medical History:  Diagnosis Date  . Acid reflux   . Allergy    Spring allergies    No past surgical history on file.  There were no vitals filed for this visit.  Subjective Assessment - 12/07/18 1117    Subjective  States that results of MRI were tendonitis and inflammation around it.    Patient Stated Goals  Get out of pain.    Currently in Pain?  Yes    Pain Score  8     Pain Location  Shoulder    Pain Orientation  Right    Pain Descriptors / Indicators  Discomfort    Pain Type  Acute pain    Pain Onset  More than a month ago    Pain Frequency  Constant         [facility] PT Assessment - 12/07/18 0001      Assessment   Medical Diagnosis  Right shoulder strain.    Referring Provider (PT)  Arville Care MD.    Onset Date/Surgical Date  09/01/18    Next MD Visit  [facility] Adult PT Treatment/Exercise - 12/07/18 0001      Shoulder Exercises: Standing   External Rotation  Strengthening;Right;20 reps;Theraband    Theraband Level (Shoulder External Rotation)  Level 1 (Yellow)    Internal Rotation  Strengthening;Right;20 reps;Theraband    Theraband Level (Shoulder Internal Rotation)  Level 1 (Yellow)    Extension  Strengthening;Right;20 reps;Theraband    Theraband Level (Shoulder Extension)  Level 1 (Yellow)    Row  Strengthening;Right;20 reps;Theraband    Theraband Level (Shoulder Row)  Level 1 (Yellow)      Shoulder Exercises: ROM/Strengthening   UBE (Upper Arm Bike)  120 RPM x8 min    Ranger  In standing flexion x20 reps, circles x20 reps      Modalities   Modalities  Electrical Stimulation;Ultrasound;Vasopneumatic      Programme researcher, broadcasting/film/video Location  R shoulder    Electrical Stimulation Action  Pre-Mod    Electrical Stimulation Parameters  80-150 hz x15 min    Electrical Stimulation Goals  Pain      Ultrasound   Ultrasound Location  R shoulder    Ultrasound Parameters  1.5 w/cm2, 100%, 1 mhz x10 min    Ultrasound Goals  Pain      Vasopneumatic   Number Minutes Vasopneumatic   15 minutes    Vasopnuematic Location   Shoulder    Vasopneumatic Pressure  Low    Vasopneumatic Temperature   34  PT Long Term Goals - 12/02/18 1337      PT LONG TERM GOAL #1   Title  Independent with a HEP.    Time  6    Period  Weeks    Status  On-going   unable to perform HEP due to pain when he tries to use arm on his own 12/02/18     PT LONG TERM GOAL #2   Title  Perform ADL's with pain not > 2/10.    Time  6    Period  Weeks    Status  On-going   patient continues to report 8/10 pain with movement on his own 12/02/18           Plan - 12/07/18 1200    Clinical Impression Statement  Patient presented in clinic with continued reports of the "same" pain in the R shoulder. Patient still experiencing numbness per patient report. Patient able to complete exercises although in slow speed and not within full range. Patient provided multimodal cueing throughout the treatment to ensure proper technique. No verbal complaints were made during today's treatment. Normal modalities response noted following end of all modalities. Patient wishes for more PT visits.    Rehab Potential  Excellent     PT Frequency  2x / week    PT Duration  6 weeks    PT Treatment/Interventions  ADLs/Self Care Home Management;Cryotherapy;Electrical Stimulation;Ultrasound;Moist Heat;Therapeutic activities;Therapeutic exercise;Patient/family education;Manual techniques;Vasopneumatic Device    PT Next Visit Plan  Cont with PROM, AAROM and modalities for pain relief until MRI results    Consulted and Agree with Plan of Care  Patient       Patient will benefit from skilled therapeutic intervention in order to improve the following deficits and impairments:  Pain  Visit Diagnosis: Acute pain of right shoulder  Abnormal posture     Problem List Patient Active Problem List   Diagnosis Date Noted  . Paronychia of great toe, right 02/19/2013  . Nail, ingrown 02/19/2013    Marvell Fuller, PTA 12/07/18 12:06 PM   [facility] [address] [place], [place], [address] Phone: [phone]   Fax:  [phone]  Name: Joshua Hurst MRN: [id number] Date of Birth: 02-Oct-1996

## 2018-12-09 ENCOUNTER — Encounter: Payer: Self-pay | Admitting: Physical Therapy

## 2018-12-09 ENCOUNTER — Ambulatory Visit: Payer: Self-pay | Admitting: Physical Therapy

## 2018-12-09 DIAGNOSIS — M25511 Pain in right shoulder: Secondary | ICD-10-CM

## 2018-12-09 DIAGNOSIS — R293 Abnormal posture: Secondary | ICD-10-CM

## 2018-12-09 NOTE — Therapy (Signed)
[facility] [address] [place], [place], [address] Phone: [phone]   Fax:  [phone]  Physical Therapy Treatment  Patient Details  Name: Joshua Hurst MRN: [id number] Date of Birth: 12/18/1995 Referring Provider (PT): Arville Care MD.   Encounter Date: 12/09/2018  PT End of Session - 12/09/18 1150    Visit Number  13    Number of Visits  16    Date for PT Re-Evaluation  12/30/18    PT Start Time  1116    PT Stop Time  1159    PT Time Calculation (min)  43 min    Activity Tolerance  Patient limited by pain;Patient tolerated treatment well    Behavior During Therapy  [facility] for tasks assessed/performed       Past Medical History:  Diagnosis Date  . Acid reflux   . Allergy    Spring allergies    History reviewed. No pertinent surgical history.  There were no vitals filed for this visit.  Subjective Assessment - 12/09/18 1123    Subjective  Patient feels ongoing discomfort     Patient Stated Goals  Get out of pain.    Currently in Pain?  Yes    Pain Score  8     Pain Location  Shoulder    Pain Orientation  Right    Pain Descriptors / Indicators  Discomfort    Pain Type  Acute pain    Pain Onset  More than a month ago    Pain Frequency  Constant    Aggravating Factors   movement    Pain Relieving Factors  rest         [facility] PT Assessment - 12/09/18 0001      ROM / Strength   AROM / PROM / Strength  Strength      Strength   Overall Strength Comments  grip strength 40# on left and 1# on right                   [facility] Adult PT Treatment/Exercise - 12/09/18 0001      Shoulder Exercises: Seated   Elevation  Strengthening;Both;20 reps    Elevation Limitations  with cane    Retraction  Strengthening;Both;20 reps    Retraction Limitations  with ball behind head for stabilization 2x10      Shoulder Exercises: Standing   External Rotation  Strengthening;Right;20 reps;Theraband    Theraband Level  (Shoulder External Rotation)  Level 1 (Yellow)    Internal Rotation  Strengthening;Right;20 reps;Theraband    Theraband Level (Shoulder Internal Rotation)  Level 1 (Yellow)    Extension  Strengthening;Right;20 reps;Theraband    Theraband Level (Shoulder Extension)  Level 1 (Yellow)    Row  Strengthening;Right;20 reps;Theraband    Theraband Level (Shoulder Row)  Level 1 (Yellow)      Shoulder Exercises: Pulleys   Flexion  5 minutes      Shoulder Exercises: ROM/Strengthening   UBE (Upper Arm Bike)  120 RPM x8 min    Wall Pushups  10 reps      Moist Heat Therapy   Number Minutes Moist Heat  15 Minutes    Moist Heat Location  Shoulder      Electrical Stimulation   Electrical Stimulation Location  R shoulder    Electrical Stimulation Action  premod    Electrical Stimulation Parameters  80-150hz  x56min    Electrical Stimulation Goals  Pain  PT Long Term Goals - 12/09/18 1151      PT LONG TERM GOAL #1   Title  Independent with a HEP.    Time  6    Period  Weeks    Status  On-going   unable to perform per reported 12/09/18     PT LONG TERM GOAL #2   Title  Perform ADL's with pain not > 2/10.    Time  6    Period  Weeks    Status  On-going   ongoing 8/10 pain reported 12/09/18           Plan - 12/09/18 1153    Clinical Impression Statement  Patient tolerated treatment well today. Patient somewhat limited due to pain reported. Patient was able to progress with exercises today and required manual assistance to perform corrent technique and to accomplish full range within exercise. Patient has little grip strength on right vs left so yellow thera putty provided. Patient reported some nech pain on right side upon arrival today, may have been a symptom that was not reported prior to therapy per patient. Patient goals ongoing at this time due to limitations.     Rehab Potential  Excellent    PT Frequency  2x / week    PT Duration  6 weeks    PT  Treatment/Interventions  ADLs/Self Care Home Management;Cryotherapy;Electrical Stimulation;Ultrasound;Moist Heat;Therapeutic activities;Therapeutic exercise;Patient/family education;Manual techniques;Vasopneumatic Device    PT Next Visit Plan  Cont with POC for gentle strengthening and modalities PRN    Consulted and Agree with Plan of Care  Patient       Patient will benefit from skilled therapeutic intervention in order to improve the following deficits and impairments:  Pain  Visit Diagnosis: Acute pain of right shoulder  Abnormal posture     Problem List Patient Active Problem List   Diagnosis Date Noted  . Paronychia of great toe, right 02/19/2013  . Nail, ingrown 02/19/2013    Wenona Mayville P, PTA 12/09/2018, 12:05 PM  [facility] [address] [place], [place], [address] Phone: [phone]   Fax:  [phone]  Name: Joshua Hurst MRN: [id number] Date of Birth: 1996-09-05

## 2018-12-14 ENCOUNTER — Ambulatory Visit: Payer: Self-pay | Admitting: *Deleted

## 2018-12-14 DIAGNOSIS — M25511 Pain in right shoulder: Secondary | ICD-10-CM

## 2018-12-14 DIAGNOSIS — R293 Abnormal posture: Secondary | ICD-10-CM

## 2018-12-14 NOTE — Therapy (Signed)
[facility] [address] [place], [place], [address] Phone: [phone]   Fax:  [phone]  Physical Therapy Treatment  Patient Details  Name: Joshua Hurst MRN: [id number] Date of Birth: 25-Jul-1996 Referring Provider (PT): Arville Care MD.   Encounter Date: 12/14/2018  PT End of Session - 12/14/18 1121    Visit Number  14    Number of Visits  16    Date for PT Re-Evaluation  12/30/18    PT Start Time  1115    PT Stop Time  1204    PT Time Calculation (min)  49 min       Past Medical History:  Diagnosis Date  . Acid reflux   . Allergy    Spring allergies    No past surgical history on file.  There were no vitals filed for this visit.  Subjective Assessment - 12/14/18 1744    Subjective  RT shldr 8/10 and LT shldr is hurting just as bad    Patient Stated Goals  Get out of pain.    Currently in Pain?  Yes    Pain Score  8     Pain Orientation  Right    Pain Descriptors / Indicators  Discomfort    Pain Type  Acute pain    Pain Onset  More than a month ago    Pain Frequency  Constant                       [facility] Adult PT Treatment/Exercise - 12/14/18 0001      Shoulder Exercises: Standing   External Rotation  Strengthening;Right;20 reps;Theraband   minimal ROM   Theraband Level (Shoulder External Rotation)  Level 1 (Yellow)    Internal Rotation  Strengthening;Right;20 reps;Theraband   minimal ROM   Theraband Level (Shoulder Internal Rotation)  Level 1 (Yellow)    Extension  Strengthening;Right;20 reps;Theraband   minimal ROM   Theraband Level (Shoulder Extension)  Level 1 (Yellow)    Row  Strengthening;Right;20 reps;Theraband   Minimal ROM   Theraband Level (Shoulder Row)  Level 1 (Yellow)      Shoulder Exercises: Pulleys   Flexion  --   unable to perform due to pain in LT shldr   Other Pulley Exercises  sitting UE ranger x 5 mins flexion/ an      Shoulder Exercises: ROM/Strengthening    UBE (Upper Arm Bike)  --   unable to perform due to pain     Modalities   Modalities  Electrical Stimulation;Ultrasound;Vasopneumatic      Moist Heat Therapy   Number Minutes Moist Heat  15 Minutes    Moist Heat Location  Shoulder      Electrical Stimulation   Electrical Stimulation Location  R shoulder    Electrical Stimulation Action  premod    Electrical Stimulation Parameters  80-150hz  x 15 mins    Electrical Stimulation Goals  Pain      Manual Therapy   Manual Therapy  Passive ROM    Passive ROM  PROM of R shoulder into flex /ABD /IR/ER wiith minimal  range  due to guarding                  PT Long Term Goals - 12/09/18 1151      PT LONG TERM GOAL #1   Title  Independent with a HEP.    Time  6    Period  Weeks    Status  On-going   unable to perform per reported 12/09/18     PT LONG TERM GOAL #2   Title  Perform ADL's with pain not > 2/10.    Time  6    Period  Weeks    Status  On-going   ongoing 8/10 pain reported 12/09/18           Plan - 12/14/18 1748    Clinical Impression Statement  Pt arrived today not doing very well and reports that LT shldr is hurting like the RT one now. He had problems with pulleys today and reports unable to perform due to pain in both shldrs. He did fair with UE ranger , but minimal  motion with tband exs. Pt was very guarded and limited in Rx today .    Clinical Decision Making  Low    Rehab Potential  Excellent    PT Frequency  2x / week    PT Treatment/Interventions  ADLs/Self Care Home Management;Cryotherapy;Electrical Stimulation;Ultrasound;Moist Heat;Therapeutic activities;Therapeutic exercise;Patient/family education;Manual techniques;Vasopneumatic Device    PT Next Visit Plan  Assess progression and benefit of PT    Consulted and Agree with Plan of Care  Patient       Patient will benefit from skilled therapeutic intervention in order to improve the following deficits and impairments:  Pain  Visit  Diagnosis: Acute pain of right shoulder  Abnormal posture     Problem List Patient Active Problem List   Diagnosis Date Noted  . Paronychia of great toe, right 02/19/2013  . Nail, ingrown 02/19/2013    Jersey Espinoza,CHRIS, PTA 12/14/2018, 6:02 PM  [facility] [address] [place], [place], [address] Phone: [phone]   Fax:  [phone]  Name: Joshua Hurst MRN: [id number] Date of Birth: [birth year]

## 2018-12-16 ENCOUNTER — Encounter: Payer: Self-pay | Admitting: Physical Therapy

## 2018-12-16 ENCOUNTER — Other Ambulatory Visit: Payer: Self-pay

## 2018-12-16 ENCOUNTER — Ambulatory Visit: Payer: Self-pay | Admitting: Physical Therapy

## 2018-12-16 DIAGNOSIS — M25511 Pain in right shoulder: Secondary | ICD-10-CM

## 2018-12-16 DIAGNOSIS — R293 Abnormal posture: Secondary | ICD-10-CM

## 2018-12-16 NOTE — Therapy (Signed)
[facility] [address] [place], [place], [address] Phone: [phone]   Fax:  [phone]  Physical Therapy Treatment  Patient Details  Name: Joshua Hurst MRN: [id number] Date of Birth: January 23, 1996 Referring Provider (PT): Arville Care MD.   Encounter Date: 12/16/2018  PT End of Session - 12/16/18 1146    Visit Number  15    Number of Visits  16    Date for PT Re-Evaluation  12/30/18    PT Start Time  1115    PT Stop Time  1155    PT Time Calculation (min)  40 min    Activity Tolerance  Patient limited by pain;Patient tolerated treatment well    Behavior During Therapy  [facility] for tasks assessed/performed       Past Medical History:  Diagnosis Date  . Acid reflux   . Allergy    Spring allergies    History reviewed. No pertinent surgical history.  There were no vitals filed for this visit.  Subjective Assessment - 12/16/18 1118    Subjective  Patient reported ongoing pain in bil shoulders    Patient Stated Goals  Get out of pain.    Currently in Pain?  Yes    Pain Score  8     Pain Location  Shoulder    Pain Orientation  Right    Pain Descriptors / Indicators  Discomfort    Pain Type  Acute pain    Pain Onset  More than a month ago    Pain Frequency  Constant    Aggravating Factors   movement    Pain Relieving Factors  rest                       [facility] Adult PT Treatment/Exercise - 12/16/18 0001      Shoulder Exercises: Supine   Other Supine Exercises  supine cane for elevation and chest press 2x10      Shoulder Exercises: Standing   Other Standing Exercises  standing wall slides bil for elevatin x    Other Standing Exercises  standing ball table flexion bil 3x10      Moist Heat Therapy   Number Minutes Moist Heat  15 Minutes    Moist Heat Location  Shoulder      Electrical Stimulation   Electrical Stimulation Location  R shoulder    Electrical Stimulation Action  premod    Electrical Stimulation Parameters  80-150h x64min    Electrical Stimulation Goals  Pain      Manual Therapy   Manual Therapy  Passive ROM    Passive ROM  PROM of R shoulder into flex /ABD /IR/ER to improve ROM  and help prevent lack of motion                  PT Long Term Goals - 12/09/18 1151      PT LONG TERM GOAL #1   Title  Independent with a HEP.    Time  6    Period  Weeks    Status  On-going   unable to perform per reported 12/09/18     PT LONG TERM GOAL #2   Title  Perform ADL's with pain not > 2/10.    Time  6    Period  Weeks    Status  On-going   ongoing 8/10 pain reported 12/09/18           Plan - 12/16/18 1150  Clinical Impression Statement  Patient reported lack of ROM and pain in bil UE. Patient performed active assistive exercises today with bil UE yet limited with range due to repored pain. Patient has faces pain scale of 1-2/10 at all times yet unable to move shoulder full range. Patient reported he can put on has, glasses, clothes, shower and eat independently yet unable to reach overhead. Today performed manual PROM to allow movement and prevent lack of motion in all directions. Patient very guarded throghout. Goals ongoing at this time. Today discussed patient to have a folow up with MD for current limitations.     Rehab Potential  Excellent    PT Frequency  2x / week    PT Duration  6 weeks    PT Treatment/Interventions  ADLs/Self Care Home Management;Cryotherapy;Electrical Stimulation;Ultrasound;Moist Heat;Therapeutic activities;Therapeutic exercise;Patient/family education;Manual techniques;Vasopneumatic Device    PT Next Visit Plan  DC next visit per PT    Consulted and Agree with Plan of Care  Patient       Patient will benefit from skilled therapeutic intervention in order to improve the following deficits and impairments:  Pain  Visit Diagnosis: Acute pain of right shoulder  Abnormal posture     Problem List Patient Active  Problem List   Diagnosis Date Noted  . Paronychia of great toe, right 02/19/2013  . Nail, ingrown 02/19/2013    ,  P, PTA 12/16/2018, 11:58 AM  [facility] [address] [place], [place], [address] Phone: [phone]   Fax:  [phone]  Name: COIT FILKINS MRN: [id number] Date of Birth: [birth year]

## 2018-12-21 ENCOUNTER — Ambulatory Visit: Payer: Self-pay | Admitting: Physical Therapy

## 2018-12-21 ENCOUNTER — Other Ambulatory Visit: Payer: Self-pay

## 2018-12-21 DIAGNOSIS — R293 Abnormal posture: Secondary | ICD-10-CM

## 2018-12-21 DIAGNOSIS — M25511 Pain in right shoulder: Secondary | ICD-10-CM

## 2018-12-21 NOTE — Therapy (Addendum)
[facility] [place], [place], [address] Phone: [phone]   Fax:  [phone]  Physical Therapy Treatment  PHYSICAL THERAPY DISCHARGE SUMMARY  Visits from Start of Care: 16  Current functional level related to goals / functional outcomes: See below   Remaining deficits: Goals not met; pain limiting   Education / Equipment: HEP  Plan: Patient agrees to discharge.  Patient goals were not met. Patient is being discharged due to lack of progress.  ?????     Gabriela Eves, PT, DPT      Patient Details  Name: Joshua Hurst MRN: [id number] Date of Birth: 1996-06-07 Referring Provider (PT): Caryl Pina MD.   Encounter Date: 12/21/2018  PT End of Session - 12/21/18 1124    Visit Number  16    Number of Visits  16    Date for PT Re-Evaluation  12/30/18    PT Start Time  1115    PT Stop Time  1208    PT Time Calculation (min)  53 min    Activity Tolerance  Patient limited by pain;Patient tolerated treatment well    Behavior During Therapy  [facility] for tasks assessed/performed       Past Medical History:  Diagnosis Date  . Acid reflux   . Allergy    Spring allergies    No past surgical history on file.  There were no vitals filed for this visit.  Subjective Assessment - 12/21/18 1218    Subjective  "I'm an 8"    Patient Stated Goals  Get out of pain.    Currently in Pain?  Yes    Pain Score  8     Pain Location  Shoulder    Pain Orientation  Right    Pain Descriptors / Indicators  Discomfort    Pain Type  Acute pain    Pain Onset  More than a month ago    Pain Frequency  Constant         [facility] PT Assessment - 12/21/18 0001      AROM   Right Shoulder Flexion  10 Degrees   AAROM 30 degrees   Right Shoulder Internal Rotation  65 Degrees   to abdomen   Right Shoulder External Rotation  25 Degrees      PROM   Right Shoulder Flexion  98 Degrees    Right Shoulder External Rotation  30  Degrees                   [facility] Adult PT Treatment/Exercise - 12/21/18 0001      Shoulder Exercises: Standing   Protraction  Strengthening;Right;Theraband    Theraband Level (Shoulder Protraction)  Level 1 (Yellow)    External Rotation  Strengthening;Right;20 reps;Theraband    Theraband Level (Shoulder External Rotation)  Level 1 (Yellow)    Internal Rotation  Strengthening;Right;20 reps;Theraband    Theraband Level (Shoulder Internal Rotation)  Level 1 (Yellow)    Row  Strengthening;Right;20 reps;Theraband    Theraband Level (Shoulder Row)  Level 1 (Yellow)    Other Standing Exercises  standing wall slides bil for elevation x 46mn    Other Standing Exercises  standing ball table flexion bil 3x10      Moist Heat Therapy   Number Minutes Moist Heat  15 Minutes    Moist Heat Location  Shoulder      Electrical Stimulation   Electrical Stimulation Location  R shoulder    Electrical Stimulation Action  pre-mod  Electrical Stimulation Parameters  80-150 hz x15 mins    Electrical Stimulation Goals  Pain             PT Education - 12/21/18 1224    Education Details  Rockwood 4 with yellow TBand    Person(s) Educated  Patient    Methods  Explanation;Demonstration;Handout    Comprehension  Returned demonstration;Verbalized understanding          PT Long Term Goals - 12/21/18 1142      PT LONG TERM GOAL #1   Title  Independent with a HEP.    Time  6    Period  Weeks    Status  Achieved      PT LONG TERM GOAL #2   Title  Perform ADL's with pain not > 2/10.    Time  6    Period  Weeks    Status  Not Met   6/10 pain 12/21/2018           Plan - 12/21/18 1124    Clinical Impression Statement  Patient arrived to PT with ongoing 8/10 pain. Patient noted with submaximal AAROM and AROM with all exercises despite motivation and education on importance of exercise. Patient noted with significant resistance with PROM despite oscillations to promote muscle  relaxation. Patient provided with HEP for rockwood 4. Patient discharged today with no goals met and lack of progress.     Stability/Clinical Decision Making  Stable/Uncomplicated    Clinical Decision Making  Low    Rehab Potential  Excellent    PT Frequency  2x / week    PT Duration  6 weeks    PT Treatment/Interventions  ADLs/Self Care Home Management;Cryotherapy;Electrical Stimulation;Ultrasound;Moist Heat;Therapeutic activities;Therapeutic exercise;Patient/family education;Manual techniques;Vasopneumatic Device    PT Next Visit Plan  DC    Consulted and Agree with Plan of Care  Patient       Patient will benefit from skilled therapeutic intervention in order to improve the following deficits and impairments:  Pain  Visit Diagnosis: Acute pain of right shoulder  Abnormal posture     Problem List Patient Active Problem List   Diagnosis Date Noted  . Paronychia of great toe, right 02/19/2013  . Nail, ingrown 02/19/2013   Gabriela Eves, PT, DPT 12/21/2018, 12:25 PM  [facility] [address] [place], [place], [address] Phone: [phone]   Fax:  [phone]  Name: Joshua Hurst MRN: [id number] Date of Birth: 30-Dec-1995

## 2018-12-23 ENCOUNTER — Encounter: Payer: Medicaid Other | Admitting: Physical Therapy
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
## 2020-04-19 ENCOUNTER — Encounter: Payer: Self-pay | Admitting: Physician Assistant

## 2020-04-19 ENCOUNTER — Ambulatory Visit (INDEPENDENT_AMBULATORY_CARE_PROVIDER_SITE_OTHER): Payer: Self-pay | Admitting: Physician Assistant

## 2020-04-19 ENCOUNTER — Other Ambulatory Visit: Payer: Self-pay

## 2020-04-19 VITALS — BP 129/82 | HR 86 | Temp 98.5°F | Resp 20 | Ht 73.0 in | Wt 202.0 lb

## 2020-04-19 DIAGNOSIS — R1310 Dysphagia, unspecified: Secondary | ICD-10-CM

## 2020-04-19 NOTE — Patient Instructions (Signed)
Dysphagia Eating Plan, Bite Size Food This diet plan is for people with moderate swallowing problems who have transitioned from pureed and minced foods. Bite size foods are soft and cut into small chunks so that they can be swallowed safely. On this eating plan, you may be instructed to drink liquids that are thickened. Work with your health care provider and your diet and nutrition specialist (dietitian) to make sure that you are following the diet safely and getting all the nutrients you need. What are tips for following this plan? General guidelines for foods   You may eat foods that are tender, soft, and moist.  Always test food texture before taking a bite. Poke food with a fork or spoon to make sure it is tender.  Food should be easy to cut and shew. Avoid large pieces of food that require a lot of chewing.  Take small bites. Each bite should be smaller than your thumb nail (about 15mm by 15 mm).  If you were on pureed and minced food diet plans, you may eat any of the foods included in those diets.  Avoid foods that are very dry, hard, sticky, chewy, coarse, or crunchy.  If instructed by your health care provider, thicken liquids. Follow your health care provider's instructions for what products to use, how to do this, and to what thickness. ? Your health care provider may recommend using a commercial thickener, rice cereal, or potato flakes. Ask your health care provider to recommend thickeners. ? Thickened liquids are usually a "pudding-like" consistency, or they may be as thick as honey or thick enough to eat with a spoon. Cooking  To moisten foods, you may add liquids while you are blending, mashing, or grinding your foods to the right consistency. These liquids include gravies, sauces, vegetable or fruit juice, milk, half and half, or water.  Strain extra liquid from foods before eating.  Reheat foods slowly to prevent a tough crust from forming.  Prepare foods in advance.  Meal planning  Eat a variety of foods to get all the nutrients you need.  Some foods may be tolerated better than others. Work with your dietitian to identify which foods are safest for you to eat.  Follow your meal plan as told by your dietitian. What foods are allowed? Grains Moist breads without nuts or seeds. Biscuits, muffins, pancakes, and waffles that are well-moistened with syrup, jelly, margarine, or butter. Cooked cereals. Moist bread stuffing. Moist rice. Well-moistened cold cereal with small chunks. Well-cooked pasta, noodles, rice, and bread dressing in small pieces and thick sauce. Soft dumplings or spaetzle in small pieces and butter or gravy. Vegetables Soft, well-cooked vegetables in small pieces. Soft-cooked, mashed potatoes. Thickened vegetable juice. Fruits Canned or cooked fruits that are soft or moist and do not have skin or seeds. Fresh, soft bananas. Thickened fruit juices. Meat and other protein foods Tender, moist meats or poultry in small pieces. Moist meatballs or meatloaf. Fish without bones. Eggs or egg substitutes in small pieces. Tofu. Tempeh and meat alternatives in small pieces. Well-cooked, tender beans, peas, baked beans, and other legumes. Dairy Thickened milk. Cream cheese. Yogurt. Cottage cheese. Sour cream. Small pieces of soft cheese. Fats and oils Butter. Oils. Margarine. Mayonnaise. Gravy. Spreads. Sweets and desserts Soft, smooth, moist desserts. Pudding. Custard. Moist cakes. Jam. Jelly. Honey. Preserves. Ask your health care provider whether you can have frozen desserts. Seasoning and other foods All seasonings and sweeteners. All sauces with small chunks. Prepared tuna, egg, or chicken   salad without raw fruits or vegetables. Moist casseroles with small, tender pieces of meat. Soups with tender meat. What foods are not allowed? Grains Coarse or dry cereals. Dry breads. Toast. Crackers. Tough, crusty breads, such as French bread and baguettes.  Dry pancakes, waffles, and muffins. Sticky rice. Dry bread stuffing. Granola. Popcorn. Chips. Vegetables All raw vegetables. Cooked corn. Rubbery or stiff cooked vegetables. Stringy vegetables, such as celery. Tough, crisp fried potatoes. Potato skins. Fruits Hard, crunchy, stringy, high-pulp, and juicy raw fruits such as apples, pineapple, papaya, and watermelon. Small, round fruits, such as grapes. Dried fruit and fruit leather. Meat and other protein foods Large pieces of meat. Dry, tough meats, such as bacon, sausage, and hot dogs. Chicken, turkey, or fish with skin and bones. Crunchy peanut butter. Nuts. Seeds. Nut and seed butters. Dairy Yogurt with nuts, seeds, or large chunks. Large chunks of cheese. Frozen desserts and milk consistency not allowed by your dietitian. Sweets and desserts Dry cakes. Chewy or dry cookies. Any desserts with nuts, seeds, dry fruits, coconut, pineapple, or anything dry, sticky, or hard. Chewy caramel. Licorice. Taffy-type candies. Ask your health care provider whether you can have frozen desserts. Seasoning and other foods Soups with tough or large chunks of meats, poultry, or vegetables. Corn or clam chowder. Smoothies with large chunks of fruit. Summary  Bite size foods can be helpful for people with moderate swallowing problems.  On the dysphagia eating plan, you may eat foods that are soft, moist, and cut into pieces smaller than 15mm by 15mm.  You may be instructed to thicken liquids. Follow your health care provider's instructions about how to do this and to what consistency. This information is not intended to replace advice given to you by your health care provider. Make sure you discuss any questions you have with your health care provider. Document Revised: 01/13/2019 Document Reviewed: 01/02/2017 Elsevier Patient Education  2020 Elsevier Inc.  

## 2020-04-19 NOTE — Progress Notes (Signed)
  Subjective:     Patient ID: Joshua Hurst, male   DOB: [birth year], 24 y.o.   MRN: [id number]  HPI Pt here due to a progressive trouble swallowing food Pt really not that verbal most hx through grandmother Now just eating ravioli States other food feels like it is getting stuck + N/V of liquid and eaten food sometimes Denies any weight loss or change in bowel habits  Recently started Nexium with sl improvement Pt denies nay caff intake  Review of Systems  Constitutional: Negative.   Respiratory: Negative.   Gastrointestinal: Positive for nausea and vomiting. Negative for abdominal distention, abdominal pain, constipation and diarrhea.       Objective:   Physical Exam Vitals and nursing note reviewed. Chaperone present: Grandmother in room.  Constitutional:      General: He is not in acute distress.    Appearance: He is not ill-appearing or toxic-appearing.  Cardiovascular:     Rate and Rhythm: Normal rate and regular rhythm.     Heart sounds: Normal heart sounds.  Pulmonary:     Effort: Pulmonary effort is normal.     Breath sounds: Normal breath sounds.  Abdominal:     General: There is no distension.     Palpations: Abdomen is soft. There is no mass.     Tenderness: There is no abdominal tenderness. There is no guarding or rebound.     Hernia: No hernia is present.  Neurological:     Mental Status: He is alert.        Assessment:     1. Dysphagia, unspecified type        Plan:     Discussed pt needing referral to GI for further eval Pt has not caff intake at this time so continue with water Sit upright for 20 min after eating Continue with the daily Nexium Grandmother is going to discuss with the pt's mother about referral and get back to [place] F/U with GI

## 2020-05-29 ENCOUNTER — Encounter: Payer: Self-pay | Admitting: Physician Assistant

## 2020-05-29 ENCOUNTER — Ambulatory Visit: Payer: Self-pay | Admitting: Physician Assistant

## 2020-05-29 VITALS — BP 120/70 | HR 76 | Ht 73.0 in | Wt 181.0 lb

## 2020-05-29 DIAGNOSIS — R1314 Dysphagia, pharyngoesophageal phase: Secondary | ICD-10-CM

## 2020-05-29 DIAGNOSIS — K219 Gastro-esophageal reflux disease without esophagitis: Secondary | ICD-10-CM

## 2020-05-29 MED ORDER — ESOMEPRAZOLE MAGNESIUM 40 MG PO CPDR: 40.0000 mg | DELAYED_RELEASE_CAPSULE | Freq: Two times a day (BID) | ORAL | 2 refills | Status: DC

## 2020-05-29 NOTE — Progress Notes (Signed)
Chief Complaint: Vomiting and dysphagia  HPI:    Mr. Joshua Hurst is a 24 year old male with a past medical history as listed below including reflux, who was referred to me by Dettinger, Elige Radon, MD for a complaint of vomiting and dysphagia.      04/19/2020 patient seen in clinic by his PCP with progressive trouble swallowing food, also described nausea and vomiting of liquid and eaten food sometimes.  He was continued on daily Nexium.    Today, the patient presents to clinic accompanied by his grandma who does assist with his history.  He tells me that over the past 2 months he has been having symptoms of regurgitation of acid and food type material in his throat often 5 to 10 min after eating and also occasional vomiting.  He feels like most foods tend to get stuck in his throat on their way down and he will typically regurgitate them, everything but "ravioli".  He has been on Nexium 20 mg daily over the past 2 months and does not feel like this is helping at all.    Denies fever, chills, weight loss, change in bowel habits or abdominal pain.  Past Medical History:  Diagnosis Date  . Acid reflux   . Allergy    Spring allergies    No past surgical history on file.  Current Outpatient Medications  Medication Sig Dispense Refill  . esomeprazole (NEXIUM) 20 MG capsule Take 20 mg by mouth daily at 12 noon.     No current facility-administered medications for this visit.    Allergies as of 05/29/2020 - Review Complete 04/19/2020  Allergen Reaction Noted  . Omnicef [cefdinir] Rash 12/25/2012  . Penicillins Rash 12/25/2012    Family History  Problem Relation Age of Onset  . Hypertension Father     Social History   Socioeconomic History  . Marital status: Single    Spouse name: Not on file  . Number of children: Not on file  . Years of education: Not on file  . Highest education level: Not on file  Occupational History  . Not on file  Tobacco Use  . Smoking status: Never Smoker    . Smokeless tobacco: Never Used  Substance and Sexual Activity  . Alcohol use: No  . Drug use: No  . Sexual activity: Never  Other Topics Concern  . Not on file  Social History Narrative  . Not on file   Social Determinants of Health   Financial Resource Strain:   . Difficulty of Paying Living Expenses: Not on file  Food Insecurity:   . Worried About Programme researcher, broadcasting/film/video in the Last Year: Not on file  . Ran Out of Food in the Last Year: Not on file  Transportation Needs:   . Lack of Transportation (Medical): Not on file  . Lack of Transportation (Non-Medical): Not on file  Physical Activity:   . Days of Exercise per Week: Not on file  . Minutes of Exercise per Session: Not on file  Stress:   . Feeling of Stress : Not on file  Social Connections:   . Frequency of Communication with Friends and Family: Not on file  . Frequency of Social Gatherings with Friends and Family: Not on file  . Attends Religious Services: Not on file  . Active Member of Clubs or Organizations: Not on file  . Attends Banker Meetings: Not on file  . Marital Status: Not on file  Intimate Partner Violence:   .  Fear of Current or Ex-Partner: Not on file  . Emotionally Abused: Not on file  . Physically Abused: Not on file  . Sexually Abused: Not on file    Review of Systems:    Constitutional: No weight loss, fever or chills Skin: No rash  Cardiovascular: No chest pain Respiratory: No SOB  Gastrointestinal: See HPI and otherwise negative Genitourinary: No dysuria  Neurological: No headache, dizziness or syncope Musculoskeletal: No new muscle or joint pain Hematologic: No bleeding  Psychiatric: No history of depression or anxiety   Physical Exam:  Vital signs: BP 120/70   Pulse 76   Ht 6\' 1"  (1.854 m)   Wt 181 lb (82.1 kg)   BMI 23.88 kg/m   Constitutional:   Pleasant Caucasian male appears to be in NAD, Well developed, Well nourished, alert and cooperative Head:   Normocephalic and atraumatic. Eyes:   PEERL, EOMI. No icterus. Conjunctiva pink. Ears:  Normal auditory acuity. Neck:  Supple Throat: Oral cavity and pharynx without inflammation, swelling or lesion.  Respiratory: Respirations even and unlabored. Lungs clear to auscultation bilaterally.   No wheezes, crackles, or rhonchi.  Cardiovascular: Normal S1, S2. No MRG. Regular rate and rhythm. No peripheral edema, cyanosis or pallor.  Gastrointestinal:  Soft, nondistended, nontender. No rebound or guarding. Normal bowel sounds. No appreciable masses or hepatomegaly. Rectal:  Not performed.  Msk:  Symmetrical without gross deformities. Without edema, no deformity or joint abnormality.  Neurologic:  Alert and  oriented x4;  grossly normal neurologically.  Skin:   Dry and intact without significant lesions or rashes. Psychiatric: Oriented to person, place and time. Intellectual disability?  No recent labs or imaging.  Assessment: 1.  Dysphagia: Food seems to get stuck in his throat; consider stricture versus esophagitis versus other 2.  GERD: Increase over the past 2 months, no relief with Nexium 20 mg daily  Plan: 1.  Scheduled patient for an EGD with dilation in the [facility] with Dr. .  Did discuss risks, benefits, limitations and alternatives and patient agrees to proceed.  He will be Covid tested 2 days prior to time of procedure. 2.  Increase Nexium to 40 mg twice daily, 30-60 min before breakfast and dinner #60 with three refills. 3.  Reviewed antireflux diet and lifestyle modifications as well as antidysphagia measures. 4.  Patient to return to clinic per recommendations from Dr. Adela Lank after time of procedure  Adela Lank, PA-C [facility] 05/29/2020, 9:21 AM  Cc: Dettinger, 05/31/2020, MD

## 2020-05-29 NOTE — Patient Instructions (Addendum)
If you are age 22 or older, your body mass index should be between 23-30. Your Body mass index is 23.88 kg/m. If this is out of the aforementioned range listed, please consider follow up with your Primary Care Provider.  If you are age 1 or younger, your body mass index should be between 19-25. Your Body mass index is 23.88 kg/m. If this is out of the aformentioned range listed, please consider follow up with your Primary Care Provider.   We have sent the following medications to your pharmacy for you to pick up at your convenience: Increase Nexium 40 mg to twice daily 30-60 minutes before breakfast and dinner.  Follow up in clinic per recommendations from Dr. Adela Lank.

## 2020-05-29 NOTE — Progress Notes (Signed)
Agree with assessment and plan as outlined.  

## 2020-05-30 ENCOUNTER — Ambulatory Visit (INDEPENDENT_AMBULATORY_CARE_PROVIDER_SITE_OTHER): Payer: Self-pay

## 2020-05-30 ENCOUNTER — Other Ambulatory Visit: Payer: Self-pay | Admitting: Gastroenterology

## 2020-05-30 DIAGNOSIS — Z1159 Encounter for screening for other viral diseases: Secondary | ICD-10-CM

## 2020-05-30 LAB — SARS CORONAVIRUS 2 (TAT 6-24 HRS): SARS Coronavirus 2: POSITIVE — AB

## 2020-05-31 ENCOUNTER — Other Ambulatory Visit: Payer: Self-pay

## 2020-05-31 ENCOUNTER — Telehealth: Payer: Self-pay | Admitting: Gastroenterology

## 2020-05-31 NOTE — Telephone Encounter (Signed)
Hey Dr Adela Lank, this pt has tested positive for covid-19 and has cancelled his procedure that was scheduled for tomorrow 8/27. Pt will call back to rsx

## 2020-05-31 NOTE — Telephone Encounter (Signed)
Yes, thank you, I am aware. We will reschedule him in a few weeks. Thanks

## 2020-06-01 ENCOUNTER — Encounter: Payer: Self-pay | Admitting: Gastroenterology

## 2020-06-07 NOTE — Telephone Encounter (Signed)
Patient's grandmother called to reschedule EGD also requesting new pre op to be sent

## 2020-06-07 NOTE — Telephone Encounter (Signed)
Called and spoke to pt's grandmother.  Discussed that he will need to have no solids after midnight and NPO after 1:00pm.  To arrive at 3:00pm.  They have the old instructions and will update the times.

## 2020-06-25 ENCOUNTER — Other Ambulatory Visit: Payer: Self-pay | Admitting: Gastroenterology

## 2020-06-26 ENCOUNTER — Telehealth: Payer: Self-pay | Admitting: *Deleted

## 2020-06-26 ENCOUNTER — Ambulatory Visit (INDEPENDENT_AMBULATORY_CARE_PROVIDER_SITE_OTHER): Payer: Self-pay

## 2020-06-26 DIAGNOSIS — Z1159 Encounter for screening for other viral diseases: Secondary | ICD-10-CM

## 2020-06-26 LAB — SARS CORONAVIRUS 2 (TAT 6-24 HRS): SARS Coronavirus 2: NEGATIVE

## 2020-06-26 NOTE — Telephone Encounter (Signed)
Thank you Joshua Hurst

## 2020-06-26 NOTE — Telephone Encounter (Signed)
Pt wasn't rescheduled for his covid test at the time of rescheduling his EGD from August. Called this morning to have patient go for COVID test today but voicemail was full. Will try to call later today.

## 2020-06-26 NOTE — Telephone Encounter (Signed)
Patient is going for his CovId test today at noon for EGD tomorrow with Dr. Adela Lank.

## 2020-06-27 ENCOUNTER — Ambulatory Visit (AMBULATORY_SURGERY_CENTER): Payer: Self-pay | Admitting: Gastroenterology

## 2020-06-27 ENCOUNTER — Encounter: Payer: Self-pay | Admitting: Gastroenterology

## 2020-06-27 ENCOUNTER — Other Ambulatory Visit: Payer: Self-pay

## 2020-06-27 VITALS — BP 114/78 | HR 78 | Temp 97.3°F | Resp 8 | Ht 73.0 in | Wt 181.0 lb

## 2020-06-27 DIAGNOSIS — K3189 Other diseases of stomach and duodenum: Secondary | ICD-10-CM

## 2020-06-27 DIAGNOSIS — R1310 Dysphagia, unspecified: Secondary | ICD-10-CM

## 2020-06-27 DIAGNOSIS — K219 Gastro-esophageal reflux disease without esophagitis: Secondary | ICD-10-CM

## 2020-06-27 DIAGNOSIS — K319 Disease of stomach and duodenum, unspecified: Secondary | ICD-10-CM

## 2020-06-27 DIAGNOSIS — K2980 Duodenitis without bleeding: Secondary | ICD-10-CM

## 2020-06-27 DIAGNOSIS — R112 Nausea with vomiting, unspecified: Secondary | ICD-10-CM

## 2020-06-27 MED ORDER — SODIUM CHLORIDE 0.9 % IV SOLN: 500.0000 mL | Freq: Once | INTRAVENOUS | Status: DC

## 2020-06-27 NOTE — Progress Notes (Signed)
Called to room to assist during endoscopic procedure.  Patient ID and intended procedure confirmed with present staff. Received instructions for my participation in the procedure from the performing physician.  

## 2020-06-27 NOTE — Op Note (Signed)
[facility] Patient Name: Joshua Hurst Procedure Date: 06/27/2020 4:28 PM MRN: [id number] Endoscopist: Viviann SpareSteven P. Adela LankArmbruster , MD Age: [age] Referring MD:  Date of Birth: Jan 31, 1996 Gender: Male Account #: [id number] Procedure:                Upper GI endoscopy Indications:              Dysphagia, Follow-up of gastro-esophageal reflux                            disease, Nausea with vomiting - on nexium 40mg                             twice daily - reflux seems controlled but dysphagia                            persists and occasional nausea / vomiting Medicines:                Monitored Anesthesia Care Procedure:                Pre-Anesthesia Assessment:                           - Prior to the procedure, a History and Physical                            was performed, and patient medications and                            allergies were reviewed. The patient's tolerance of                            previous anesthesia was also reviewed. The risks                            and benefits of the procedure and the sedation                            options and risks were discussed with the patient.                            All questions were answered, and informed consent                            was obtained. Prior Anticoagulants: The patient has                            taken no previous anticoagulant or antiplatelet                            agents. ASA Grade Assessment: II - A patient with                            mild systemic disease. After reviewing the risks  and benefits, the patient was deemed in                            satisfactory condition to undergo the procedure.                           After obtaining informed consent, the endoscope was                            passed under direct vision. Throughout the                            procedure, the patient's blood pressure, pulse, and                            oxygen  saturations were monitored continuously. The                            Endoscope was introduced through the mouth, and                            advanced to the second part of duodenum. The upper                            GI endoscopy was accomplished without difficulty.                            The patient tolerated the procedure well. Scope In: Scope Out: Findings:                 Esophagogastric landmarks were identified: the                            Z-line was found at 40 cm, the gastroesophageal                            junction was found at 40 cm and the upper extent of                            the gastric folds was found at 40 cm from the                            incisors.                           The exam of the esophagus was otherwise normal. No                            stenosis / stricture or inflammatory changes.                           A guidewire was placed and the scope was withdrawn.  Empiric dilation was performed in the entire                            esophagus with a Savary dilator with mild                            resistance at 17 mm. Relook endoscopy showed no                            mucosal wrents. Biopsies were taken with a cold                            forceps in the upper third of the esophagus, in the                            middle third of the esophagus and in the lower                            third of the esophagus for histology, rule out                            eosinophilic esophagitis.                           The entire examined stomach was normal. Biopsies                            were taken with a cold forceps for Helicobacter                            pylori testing.                           A suspected minor papilla vs. small polypoid lesion                            was found in the second portion of the duodenum.                            Biopsies were taken with a cold forceps for                             histology to rule out adenoma. The major papillla                            was seen and appeared normal.                           The exam of the duodenum was otherwise normal.                           Biopsies for histology were taken with a cold  forceps in the duodenal bulb and in the second                            portion of the duodenum for evaluation of celiac                            disease. Complications:            No immediate complications. Estimated blood loss:                            Minimal. Estimated Blood Loss:     Estimated blood loss was minimal. Impression:               - Esophagogastric landmarks identified.                           - Normal esophagus otherwise - empiric dilation                            performed and biopsies obtained as above                           - Normal stomach. Biopsied.                           - Suspected minor papilla in the duodenum. Biopsied                            to rule out adenomatous change.                           - Normal duodenum oherwise.                           - Biopsies were taken with a cold forceps for                            evaluation of celiac disease. Recommendation:           - Patient has a contact number available for                            emergencies. The signs and symptoms of potential                            delayed complications were discussed with the                            patient. Return to normal activities tomorrow.                            Written discharge instructions were provided to the                            patient.                           -  Resume previous diet.                           - Continue present medications.                           - Await pathology results and course post dilation.                            If dysphagia persists will workup for motility                             disorder. Further recommendations pending biopsy                            results. Viviann Spare P. Adela Lank, MD 06/27/2020 5:05:21 PM This report has been signed electronically.

## 2020-06-27 NOTE — Patient Instructions (Signed)
YOU HAD AN ENDOSCOPIC PROCEDURE TODAY AT THE [facility]:   Refer to the procedure report that was given to you for any specific questions about what was found during the examination.  If the procedure report does not answer your questions, please call your gastroenterologist to clarify.  If you requested that your care partner not be given the details of your procedure findings, then the procedure report has been included in a sealed envelope for you to review at your convenience later.  YOU SHOULD EXPECT: Some feelings of bloating in the abdomen. Passage of more gas than usual.  Walking can help get rid of the air that was put into your GI tract during the procedure and reduce the bloating. If you had a lower endoscopy (such as a colonoscopy or flexible sigmoidoscopy) you may notice spotting of blood in your stool or on the toilet paper. If you underwent a bowel prep for your procedure, you may not have a normal bowel movement for a few days.  Please Note:  You might notice some irritation and congestion in your nose or some drainage.  This is from the oxygen used during your procedure.  There is no need for concern and it should clear up in a day or so.  SYMPTOMS TO REPORT IMMEDIATELY:    Following upper endoscopy (EGD)  Vomiting of blood or coffee ground material  New chest pain or pain under the shoulder blades  Painful or persistently difficult swallowing  New shortness of breath  Fever of 100F or higher  Black, tarry-looking stools  For urgent or emergent issues, a gastroenterologist can be reached at any hour by calling [phone]. Do not use MyChart messaging for urgent concerns.    DIET:  We do recommend a small meal at first, but then you may proceed to your regular diet.  Drink plenty of fluids but you should avoid alcoholic beverages for 24 hours.  ACTIVITY:  You should plan to take it easy for the rest of today and you should NOT DRIVE or use heavy machinery  until tomorrow (because of the sedation medicines used during the test).    FOLLOW UP: Our staff will call the number listed on your records 48-72 hours following your procedure to check on you and address any questions or concerns that you may have regarding the information given to you following your procedure. If we do not reach you, we will leave a message.  We will attempt to reach you two times.  During this call, we will ask if you have developed any symptoms of COVID 19. If you develop any symptoms (ie: fever, flu-like symptoms, shortness of breath, cough etc.) before then, please call [phone].  If you test positive for Covid 19 in the 2 weeks post procedure, please call and report this information to us.    If any biopsies were taken you will be contacted by phone or by letter within the next 1-3 weeks.  Please call us at [phone] if you have not heard about the biopsies in 3 weeks.    SIGNATURES/CONFIDENTIALITY: You and/or your care partner have signed paperwork which will be entered into your electronic medical record.  These signatures attest to the fact that that the information above on your After Visit Summary has been reviewed and is understood.  Full responsibility of the confidentiality of this discharge information lies with you and/or your care-partner. 

## 2020-06-27 NOTE — Progress Notes (Signed)
Report to PACU, RN, vss, BBS= Clear.  

## 2020-06-29 ENCOUNTER — Telehealth: Payer: Self-pay

## 2020-06-29 NOTE — Telephone Encounter (Signed)
  Follow up Call-  Call back number 06/27/2020  Post procedure Call Back phone  # [phone]  Permission to leave phone message Yes  Some recent data might be hidden     Patient questions:  Do you have a fever, pain , or abdominal swelling? No. Pain Score  0 *  Have you tolerated food without any problems? Yes.    Have you been able to return to your normal activities? Yes.    Do you have any questions about your discharge instructions: Diet   No. Medications  No. Follow up visit  No.  Do you have questions or concerns about your Care? No.  Actions: * If pain score is 4 or above: No action needed, pain <4.  1. Have you developed a fever since your procedure? no  2.   Have you had an respiratory symptoms (SOB or cough) since your procedure?no 3.   Have you tested positive for COVID 19 since your procedure no  4.   Have you had any family members/close contacts diagnosed with the COVID 19 since your procedure?  no   If yes to any of these questions please route to Laverna Peace, RN and Karlton Lemon, RN

## 2024-02-27 ENCOUNTER — Emergency Department (HOSPITAL_COMMUNITY)

## 2024-02-27 ENCOUNTER — Emergency Department (HOSPITAL_COMMUNITY)
Admission: EM | Admit: 2024-02-27 | Discharge: 2024-02-27 | Disposition: A | Discharge status: Home / Self Care | Attending: Emergency Medicine | Admitting: Emergency Medicine

## 2024-02-27 ENCOUNTER — Encounter (HOSPITAL_COMMUNITY): Payer: Self-pay | Admitting: Emergency Medicine

## 2024-02-27 ENCOUNTER — Other Ambulatory Visit: Payer: Self-pay

## 2024-02-27 DIAGNOSIS — K223 Perforation of esophagus: Secondary | ICD-10-CM | POA: Diagnosis not present

## 2024-02-27 DIAGNOSIS — J982 Interstitial emphysema: Secondary | ICD-10-CM

## 2024-02-27 DIAGNOSIS — R1110 Vomiting, unspecified: Secondary | ICD-10-CM | POA: Diagnosis present

## 2024-02-27 DIAGNOSIS — R799 Abnormal finding of blood chemistry, unspecified: Secondary | ICD-10-CM | POA: Diagnosis not present

## 2024-02-27 HISTORY — DX: Unspecified lack of expected normal physiological development in childhood: R62.50

## 2024-02-27 LAB — COMPREHENSIVE METABOLIC PANEL WITH GFR
ALT: 13 U/L (ref 0–44)
AST: 14 U/L — ABNORMAL LOW (ref 15–41)
Albumin: 5 g/dL (ref 3.5–5.0)
Alkaline Phosphatase: 49 U/L (ref 38–126)
Anion gap: 11 (ref 5–15)
BUN: 13 mg/dL (ref 6–20)
CO2: 24 mmol/L (ref 22–32)
Calcium: 9.5 mg/dL (ref 8.9–10.3)
Chloride: 100 mmol/L (ref 98–111)
Creatinine, Ser: 1.12 mg/dL (ref 0.61–1.24)
GFR, Estimated: >60 mL/min (ref >60)
Glucose, Bld: 120 mg/dL — ABNORMAL HIGH (ref 70–99)
Potassium: 3.6 mmol/L (ref 3.5–5.1)
Sodium: 135 mmol/L (ref 135–145)
Total Bilirubin: 1.6 mg/dL — ABNORMAL HIGH (ref 0.0–1.2)
Total Protein: 7.6 g/dL (ref 6.5–8.1)

## 2024-02-27 LAB — CBC WITH DIFFERENTIAL/PLATELET
Abs Immature Granulocytes: 0.02 10*3/uL (ref 0.00–0.07)
Basophils Absolute: 0 10*3/uL (ref 0.0–0.1)
Basophils Relative: 0 %
Eosinophils Absolute: 0 10*3/uL (ref 0.0–0.5)
Eosinophils Relative: 0 %
HCT: 43.8 % (ref 39.0–52.0)
Hemoglobin: 14.7 g/dL (ref 13.0–17.0)
Immature Granulocytes: 0 %
Lymphocytes Relative: 9 %
Lymphs Abs: 0.8 10*3/uL (ref 0.7–4.0)
MCH: 29.6 pg (ref 26.0–34.0)
MCHC: 33.6 g/dL (ref 30.0–36.0)
MCV: 88.1 fL (ref 80.0–100.0)
Monocytes Absolute: 0.7 10*3/uL (ref 0.1–1.0)
Monocytes Relative: 7 %
Neutro Abs: 8.1 10*3/uL — ABNORMAL HIGH (ref 1.7–7.7)
Neutrophils Relative %: 84 %
Platelets: 204 10*3/uL (ref 150–400)
RBC: 4.97 MIL/uL (ref 4.22–5.81)
RDW: 13 % (ref 11.5–15.5)
WBC: 9.7 10*3/uL (ref 4.0–10.5)
nRBC: 0 % (ref 0.0–0.2)

## 2024-02-27 LAB — BLOOD GAS, ARTERIAL
Acid-Base Excess: 1.2 mmol/L (ref 0.0–2.0)
Bicarbonate: 26 mmol/L (ref 20.0–28.0)
Drawn by: 22179
FIO2: 21 %
O2 Saturation: 97.7 %
Patient temperature: 37.2
pCO2 arterial: 41 mmHg (ref 32–48)
pH, Arterial: 7.41 (ref 7.35–7.45)
pO2, Arterial: 77 mmHg — ABNORMAL LOW (ref 83–108)

## 2024-02-27 LAB — CBG MONITORING, ED: Glucose-Capillary: 103 mg/dL — ABNORMAL HIGH (ref 70–99)

## 2024-02-27 LAB — SALICYLATE LEVEL: Salicylate Lvl: <7 mg/dL — ABNORMAL LOW (ref 7.0–30.0)

## 2024-02-27 LAB — ACETAMINOPHEN LEVEL: Acetaminophen (Tylenol), Serum: <10 ug/mL — ABNORMAL LOW (ref 10–30)

## 2024-02-27 LAB — ETHANOL: Alcohol, Ethyl (B): <15 mg/dL (ref <15)

## 2024-02-27 MED ORDER — VANCOMYCIN HCL IN DEXTROSE 1-5 GM/200ML-% IV SOLN
1000.0000 mg | Freq: Once | INTRAVENOUS | Status: DC
Start: 2024-02-27 — End: 2024-02-27

## 2024-02-27 MED ORDER — ONDANSETRON 8 MG PO TBDP: 8.0000 mg | ORAL_TABLET | Freq: Three times a day (TID) | ORAL | 0 refills | Status: DC | PRN

## 2024-02-27 MED ORDER — IOHEXOL 300 MG/ML  SOLN
75.0000 mL | Freq: Once | INTRAMUSCULAR | Status: AC | PRN
  Administered 2024-02-27: 75 mL via INTRAVENOUS

## 2024-02-27 MED ORDER — SODIUM CHLORIDE 0.9 % IV BOLUS
1000.0000 mL | Freq: Once | INTRAVENOUS | Status: AC
  Administered 2024-02-27: 1000 mL via INTRAVENOUS

## 2024-02-27 MED ORDER — VANCOMYCIN HCL 1500 MG/300ML IV SOLN
1500.0000 mg | Freq: Once | INTRAVENOUS | Status: AC
  Administered 2024-02-27: 1500 mg via INTRAVENOUS
  Filled 2024-02-27: qty 300

## 2024-02-27 MED ORDER — SODIUM CHLORIDE 0.9 % IV SOLN
1.0000 g | Freq: Once | INTRAVENOUS | Status: AC
  Administered 2024-02-27: 1 g via INTRAVENOUS
  Filled 2024-02-27: qty 1000

## 2024-02-27 MED ORDER — ONDANSETRON HCL 4 MG/2ML IJ SOLN
4.0000 mg | Freq: Once | INTRAMUSCULAR | Status: AC
  Administered 2024-02-27: 4 mg via INTRAVENOUS
  Filled 2024-02-27: qty 2

## 2024-02-27 MED ORDER — ALUM & MAG HYDROXIDE-SIMETH 200-200-20 MG/5ML PO SUSP
30.0000 mL | Freq: Once | ORAL | Status: AC
  Administered 2024-02-27: 30 mL via ORAL
  Filled 2024-02-27: qty 30

## 2024-02-27 MED ORDER — IOHEXOL 300 MG/ML  SOLN
100.0000 mL | Freq: Once | INTRAMUSCULAR | Status: AC | PRN
  Administered 2024-02-27: 55 mL via ORAL

## 2024-02-27 NOTE — ED Notes (Signed)
 Pt states he is unable to provide urine at this time. Provided pt with urinal.
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
## 2024-02-27 NOTE — ED Notes (Signed)
 [facility] called for transport ED to ED to [facility]

## 2024-02-27 NOTE — ED Provider Notes (Addendum)
 Patient transferred here from [facility] for esophagram.  Please see prior providers note for full details but concern for possible esophageal rupture.  I did discuss the possibility of intentional toxic ingestion with patient's family states that this is not the case.    3:17 PM Patient's esophagram showed no evidence of rupture.  Case discussed with Dr. Deloise Ferries from cardiothoracic surgery.  Recommends no further invention at this time.  Patient able to drink water without difficulty.  Had long discussion with patient and family at bedside.  They agree that they are not concerned by a possible suicide attempt.  They do note over the last 6 years patient has had some change in his mental status.  I will give patient referral to pulmonary for the pneumomediastinum.  Return precautions given.   Lind Repine, MD 02/27/24 1336    Lind Repine, MD 02/27/24 1519    Lind Repine, MD 02/27/24 [phone]

## 2024-02-27 NOTE — ED Triage Notes (Signed)
 Pt BIB [facility] after parents called out for pt being unresponsive and large amounts of emesis, pt responsive to verbal stimuli upon EMS arrival, per EMS there was an empty bottles of peroxide and household cleaners in the pts bedroom, per EMS pt recently told his parents he feels like bugs are crawling out of him, pt states during triage that he did not drink the cleaners but used them to clean his bed to kill bugs, EDP at bedside during triage, EMS v/s 120s palp, HR 82, 100% RA, CBG 110

## 2024-02-27 NOTE — ED Notes (Signed)
 Pt not in room at this time; off at scans. When he returns, will collect CBG and request urine again.

## 2024-02-27 NOTE — ED Notes (Signed)
 [facility] in route to transport pt

## 2024-02-28 LAB — OSMOLALITY: Osmolality: 294 mosm/kg (ref 275–295)

## 2024-03-04 ENCOUNTER — Ambulatory Visit (INDEPENDENT_AMBULATORY_CARE_PROVIDER_SITE_OTHER): Admitting: Student in an Organized Health Care Education/Training Program

## 2024-03-04 ENCOUNTER — Encounter: Payer: Self-pay | Admitting: Student in an Organized Health Care Education/Training Program

## 2024-03-04 VITALS — BP 116/76 | HR 85 | Wt 184.0 lb

## 2024-03-04 DIAGNOSIS — R112 Nausea with vomiting, unspecified: Secondary | ICD-10-CM | POA: Diagnosis not present

## 2024-03-04 DIAGNOSIS — J982 Interstitial emphysema: Secondary | ICD-10-CM | POA: Diagnosis not present

## 2024-03-04 DIAGNOSIS — R634 Abnormal weight loss: Secondary | ICD-10-CM | POA: Insufficient documentation

## 2024-03-04 DIAGNOSIS — R1319 Other dysphagia: Secondary | ICD-10-CM | POA: Insufficient documentation

## 2024-03-04 DIAGNOSIS — F39 Unspecified mood [affective] disorder: Secondary | ICD-10-CM | POA: Insufficient documentation

## 2024-03-04 DIAGNOSIS — F323 Major depressive disorder, single episode, severe with psychotic features: Secondary | ICD-10-CM | POA: Insufficient documentation

## 2024-03-04 DIAGNOSIS — R6889 Other general symptoms and signs: Secondary | ICD-10-CM | POA: Insufficient documentation

## 2024-03-04 LAB — CBC WITH DIFFERENTIAL/PLATELET
Basophils Absolute: 0 10*3/uL (ref 0.0–0.1)
Basophils Relative: 0.6 % (ref 0.0–3.0)
Eosinophils Absolute: 0.1 10*3/uL (ref 0.0–0.7)
Eosinophils Relative: 0.8 % (ref 0.0–5.0)
HCT: 47.1 % (ref 39.0–52.0)
Hemoglobin: 16 g/dL (ref 13.0–17.0)
Lymphocytes Relative: 16 % (ref 12.0–46.0)
Lymphs Abs: 1.1 10*3/uL (ref 0.7–4.0)
MCHC: 34 g/dL (ref 30.0–36.0)
MCV: 85.3 fl (ref 78.0–100.0)
Monocytes Absolute: 0.4 10*3/uL (ref 0.1–1.0)
Monocytes Relative: 6.5 % (ref 3.0–12.0)
Neutro Abs: 5 10*3/uL (ref 1.4–7.7)
Neutrophils Relative %: 76.1 % (ref 43.0–77.0)
Platelets: 219 10*3/uL (ref 150.0–400.0)
RBC: 5.53 Mil/uL (ref 4.22–5.81)
RDW: 13 % (ref 11.5–15.5)
WBC: 6.6 10*3/uL (ref 4.0–10.5)

## 2024-03-04 LAB — HEMOGLOBIN A1C: Hgb A1c MFr Bld: 4.8 % (ref 4.6–6.5)

## 2024-03-04 LAB — COMPREHENSIVE METABOLIC PANEL WITH GFR
ALT: 9 U/L (ref 0–53)
AST: 10 U/L (ref 0–37)
Albumin: 5.4 g/dL — ABNORMAL HIGH (ref 3.5–5.2)
Alkaline Phosphatase: 50 U/L (ref 39–117)
BUN: 6 mg/dL (ref 6–23)
CO2: 26 meq/L (ref 19–32)
Calcium: 10.2 mg/dL (ref 8.4–10.5)
Chloride: 97 meq/L (ref 96–112)
Creatinine, Ser: 1.12 mg/dL (ref 0.40–1.50)
GFR: 90.93 mL/min (ref >60.00)
Glucose, Bld: 65 mg/dL — ABNORMAL LOW (ref 70–99)
Potassium: 4.5 meq/L (ref 3.5–5.1)
Sodium: 140 meq/L (ref 135–145)
Total Bilirubin: 1.3 mg/dL — ABNORMAL HIGH (ref 0.2–1.2)
Total Protein: 7.6 g/dL (ref 6.0–8.3)

## 2024-03-04 LAB — IBC + FERRITIN
Ferritin: 138.5 ng/mL (ref 22.0–322.0)
Iron: 52 ug/dL (ref 42–165)
Saturation Ratios: 16.1 % — ABNORMAL LOW (ref 20.0–50.0)
TIBC: 323.4 ug/dL (ref 250.0–450.0)
Transferrin: 231 mg/dL (ref 212.0–360.0)

## 2024-03-04 LAB — VITAMIN B12: Vitamin B-12: 163 pg/mL — ABNORMAL LOW (ref 211–911)

## 2024-03-04 LAB — TSH: TSH: 1.89 u[IU]/mL (ref 0.35–5.50)

## 2024-03-04 LAB — LIPASE: Lipase: 8 U/L — ABNORMAL LOW (ref 11.0–59.0)

## 2024-03-04 NOTE — Progress Notes (Signed)
 New Patient Office Visit  Subjective    Patient ID: Joshua Hurst, male    DOB: [birth year]  Age: 28 y.o. MRN: [id number]  CC:  Chief Complaint  Patient presents with   Hospitalization Follow-up    Follow up from 02/27/2024 hospital visit.     HPI  Joshua Hurst presents to establish care  28 year old person here for follow-up from the emergency departments.  Accompanied by his father today.  Patient does not provide much history because of a severely blunted affect.  History is obtained from chart review and from the father.  Patient has had months long issues with intermittent nausea and vomiting.  This was exceptionally bad on Saturday when he had large-volume emesis.  He was taken to the emergency department, CT incidentally showed pneumomediastinum.  He had a follow-up evaluation with a barium esophagram that ruled out a perforation.  He was discharged home from the emergency department and has been doing okay there.  He reports about 40 pounds of weight loss over the last 6 months that has been unintentional, mostly related to intermittent nausea and vomiting.  He reports that his throat is sore still.  Endorses at times getting food stuck in his throat and mid chest.  Patient says he has been able to eat some mashed potatoes and soup recently.  Father tells me that his diet is mostly fast food.  Patient has not had a physician in many years.  He was previously cared for at [facility] family practice.  They report having a normal childhood, no history of developmental delay.  No history of psychiatric illness.  Patient takes no medications.  The patient did not finish high school, but they report that his mother did his work for him.  He did not go to college.  He does not have a job.  He struggles to sleep at night because he is afraid of the dark.  He is afraid that there are things outside that might hurt him.  He denies hearing voices.  He sleeps through a lot of the day.  He  likes to spend his time watching anime and Pokmon, likes to play video games.  He feels no motivation to find a job and has no plan for the future.  He denies feeling depressed or anxious.    Outpatient Encounter Medications as of 03/04/2024  Medication Sig   esomeprazole  (NEXIUM ) 40 MG capsule Take 1 capsule (40 mg total) by mouth 2 (two) times daily before a meal.   ondansetron  (ZOFRAN -ODT) 8 MG disintegrating tablet Take 1 tablet (8 mg total) by mouth every 8 (eight) hours as needed for nausea or vomiting.   No facility-administered encounter medications on file as of 03/04/2024.    Past Medical History:  Diagnosis Date   Acid reflux    Allergy    Spring allergies   Development delay     Family History  Problem Relation Age of Onset   Hypertension Father         Objective    BP 116/76   Pulse 85   Wt 184 lb (83.5 kg)   SpO2 98%   BMI 24.28 kg/m   Physical Exam  Gen: Withdrawn young man Psych: Very blunted affect, will make eye contact, answers most questions as yes or no, depressed appearing at times, not tearful, follows instructions, speech is intelligible but I cannot assess if his thoughts are linear.  He is not responding to internal stimuli. Neuro: Alert,  nonconversational, full strength upper and lower extremities, normal get up and go, normal gait Eyes: Normal Ears: Normal tympanic membranes bilaterally Neck: Normal thyroid, no nodules Heart: Regular, no murmur or rubs Lungs: Unlabored, clear throughout Abd: Recent weight loss, thin striae on his abdomen, nontender, no organomegaly Ext: Warm, no edema, normal joints Skin: No rashes     Assessment & Plan:   Problem List Items Addressed This Visit       High   Nausea & vomiting - Primary (Chronic)   Chronic intermittent problem.  Patient tells me this has been going on months to years.  All sounds like gastroparesis at times with large volume vomiting.  Will do labs today to look for underlying  etiology.  This is associated with unintentional weight loss, so at risk for a structural issue.  Will refer to GI to consider endoscopy.      Relevant Orders   Ambulatory referral to Gastroenterology   Comprehensive metabolic panel with GFR   Hemoglobin A1c   Celiac Disease Comprehensive Panel with Reflexes   IBC + Ferritin   Vitamin B12   Lipase   Disorder of affect (HCC) (Chronic)   Chronic issue.  Patient with very blunted affect.  He appears to be quite depressed, very socially isolated, he has total de-motivation.  No history of psychiatric care.  He does have paranoia about the dark, but denies auditory or visual hallucinations.  Etiology for this is unclear to me, differential includes severe depression, bipolar disease, schizoaffective disorder, or spectrum disorder.  Given the severity and functional disability, will refer to psychiatry for evaluation.      Relevant Orders   Ambulatory referral to Psychiatry   Unintentional weight loss (Chronic)   Patient reports about 40 pounds of unintentional weight loss over 6 months.  Weight today is 184 pounds with a BMI of 24.  This is associated with chronic nausea and vomiting.  Also seems to have some type of untreated psychiatric disorder.  Will do evaluation today with labs to look for an organic cause of the weight loss.      Relevant Orders   Comprehensive metabolic panel with GFR   CBC with Differential/Platelet   Hemoglobin A1c   HIV Antibody (routine testing w rflx)   TSH   Celiac Disease Comprehensive Panel with Reflexes   IBC + Ferritin   Vitamin B12   Pneumomediastinum (HCC)   Acute problem diagnosed on CT scan earlier this week.  Etiology most likely from barotrauma from vomiting.  Esophagram ruled out esophageal perforation.  Seems to be recovering well at home.  No worsening chest pain.  Using Zofran  8 mg as needed to prevent further vomiting, and using Nexium  40 mg twice daily.      Relevant Orders   Comprehensive  metabolic panel with GFR   CBC with Differential/Platelet     Medium    Esophageal dysphagia   Patient reports a history of endoscopy with esophageal dilation around the age of 38.  He endorses symptoms of esophageal dysphagia, food getting stuck in his chest.  Complicated by chronic nausea vomiting and unintentional weight loss.  Will refer to GI to consider repeat endoscopy.  Recent barium esophagram was normal.      Relevant Orders   Ambulatory referral to Gastroenterology     Low   Somatic complaints, multiple   Chronic issue of multiple somatic complaints including pain in both shoulders, pain in both legs, pain in his throat.  He often cites  these as the reasons that he does not do more outside of the house, but has not sought medical evaluation for these issues in many years.  Exam today is normal and reassuring.  I suspect this is related to a psychiatric diagnosis.       Return in about 4 weeks (around 04/01/2024).   Ether Hercules, MD

## 2024-03-04 NOTE — Assessment & Plan Note (Signed)
 Patient reports a history of endoscopy with esophageal dilation around the age of 46.  He endorses symptoms of esophageal dysphagia, food getting stuck in his chest.  Complicated by chronic nausea vomiting and unintentional weight loss.  Will refer to GI to consider repeat endoscopy.  Recent barium esophagram was normal.

## 2024-03-04 NOTE — Assessment & Plan Note (Signed)
 Patient reports about 40 pounds of unintentional weight loss over 6 months.  Weight today is 184 pounds with a BMI of 24.  This is associated with chronic nausea and vomiting.  Also seems to have some type of untreated psychiatric disorder.  Will do evaluation today with labs to look for an organic cause of the weight loss.

## 2024-03-04 NOTE — Assessment & Plan Note (Signed)
 Acute problem diagnosed on CT scan earlier this week.  Etiology most likely from barotrauma from vomiting.  Esophagram ruled out esophageal perforation.  Seems to be recovering well at home.  No worsening chest pain.  Using Zofran  8 mg as needed to prevent further vomiting, and using Nexium  40 mg twice daily.

## 2024-03-04 NOTE — Assessment & Plan Note (Signed)
 Chronic issue.  Patient with very blunted affect.  He appears to be quite depressed, very socially isolated, he has total de-motivation.  No history of psychiatric care.  He does have paranoia about the dark, but denies auditory or visual hallucinations.  Etiology for this is unclear to me, differential includes severe depression, bipolar disease, schizoaffective disorder, or spectrum disorder.  Given the severity and functional disability, will refer to psychiatry for evaluation.

## 2024-03-04 NOTE — Assessment & Plan Note (Signed)
 Chronic intermittent problem.  Patient tells me this has been going on months to years.  All sounds like gastroparesis at times with large volume vomiting.  Will do labs today to look for underlying etiology.  This is associated with unintentional weight loss, so at risk for a structural issue.  Will refer to GI to consider endoscopy.

## 2024-03-04 NOTE — Assessment & Plan Note (Signed)
 Chronic issue of multiple somatic complaints including pain in both shoulders, pain in both legs, pain in his throat.  He often cites these as the reasons that he does not do more outside of the house, but has not sought medical evaluation for these issues in many years.  Exam today is normal and reassuring.  I suspect this is related to a psychiatric diagnosis.

## 2024-03-07 ENCOUNTER — Ambulatory Visit: Payer: Self-pay | Admitting: Student in an Organized Health Care Education/Training Program

## 2024-03-07 LAB — CELIAC DISEASE COMPREHENSIVE PANEL WITH REFLEXES
(tTG) Ab, IgA: <1 U/mL
Immunoglobulin A: 187 mg/dL (ref 47–310)

## 2024-03-07 LAB — HIV ANTIBODY (ROUTINE TESTING W REFLEX): HIV 1&2 Ab, 4th Generation: NONREACTIVE

## 2024-03-15 ENCOUNTER — Encounter (INDEPENDENT_AMBULATORY_CARE_PROVIDER_SITE_OTHER): Payer: Self-pay | Admitting: *Deleted

## 2024-03-29 ENCOUNTER — Telehealth: Payer: Self-pay

## 2024-03-29 NOTE — Telephone Encounter (Signed)
 Copied from CRM [id number]. Topic: Referral - Status >> Mar 29, 2024  8:38 AM Laymon HERO wrote: Reason for CRM: Patient father calling, he has not received a call for referral to Psychiatry

## 2024-03-30 NOTE — Telephone Encounter (Signed)
 Can you check status of referral?

## 2024-04-01 ENCOUNTER — Encounter: Payer: Self-pay | Admitting: Student in an Organized Health Care Education/Training Program

## 2024-04-01 ENCOUNTER — Encounter (INDEPENDENT_AMBULATORY_CARE_PROVIDER_SITE_OTHER): Payer: Self-pay | Admitting: Gastroenterology

## 2024-04-01 ENCOUNTER — Telehealth: Payer: Self-pay | Admitting: *Deleted

## 2024-04-01 ENCOUNTER — Ambulatory Visit: Admitting: Student in an Organized Health Care Education/Training Program

## 2024-04-01 ENCOUNTER — Encounter: Payer: Self-pay | Admitting: *Deleted

## 2024-04-01 ENCOUNTER — Ambulatory Visit (INDEPENDENT_AMBULATORY_CARE_PROVIDER_SITE_OTHER): Admitting: Gastroenterology

## 2024-04-01 ENCOUNTER — Ambulatory Visit (INDEPENDENT_AMBULATORY_CARE_PROVIDER_SITE_OTHER): Admitting: Student in an Organized Health Care Education/Training Program

## 2024-04-01 VITALS — BP 121/72 | HR 68 | Wt 186.0 lb

## 2024-04-01 VITALS — BP 111/73 | HR 71 | Temp 97.8°F | Ht 72.0 in | Wt 186.4 lb

## 2024-04-01 DIAGNOSIS — E538 Deficiency of other specified B group vitamins: Secondary | ICD-10-CM

## 2024-04-01 DIAGNOSIS — R1310 Dysphagia, unspecified: Secondary | ICD-10-CM

## 2024-04-01 DIAGNOSIS — F39 Unspecified mood [affective] disorder: Secondary | ICD-10-CM | POA: Diagnosis not present

## 2024-04-01 DIAGNOSIS — R634 Abnormal weight loss: Secondary | ICD-10-CM

## 2024-04-01 DIAGNOSIS — R6889 Other general symptoms and signs: Secondary | ICD-10-CM | POA: Diagnosis not present

## 2024-04-01 DIAGNOSIS — K219 Gastro-esophageal reflux disease without esophagitis: Secondary | ICD-10-CM | POA: Insufficient documentation

## 2024-04-01 DIAGNOSIS — R1319 Other dysphagia: Secondary | ICD-10-CM

## 2024-04-01 DIAGNOSIS — N50819 Testicular pain, unspecified: Secondary | ICD-10-CM | POA: Diagnosis not present

## 2024-04-01 DIAGNOSIS — R112 Nausea with vomiting, unspecified: Secondary | ICD-10-CM | POA: Diagnosis not present

## 2024-04-01 LAB — POCT URINALYSIS DIPSTICK
Bilirubin, UA: NEGATIVE
Blood, UA: NEGATIVE
Glucose, UA: NEGATIVE
Ketones, UA: NEGATIVE
Leukocytes, UA: NEGATIVE
Nitrite, UA: NEGATIVE
Protein, UA: NEGATIVE
Spec Grav, UA: 1.01 (ref 1.010–1.025)
Urobilinogen, UA: 0.2 U/dL
pH, UA: 6 (ref 5.0–8.0)

## 2024-04-01 MED ORDER — PANTOPRAZOLE SODIUM 40 MG PO PACK: 40.0000 mg | PACK | Freq: Every day | ORAL | 2 refills | Status: DC

## 2024-04-01 MED ORDER — CYANOCOBALAMIN 1000 MCG/ML IJ SOLN
1000.0000 ug | INTRAMUSCULAR | Status: AC
  Administered 2024-04-01 – 2024-04-29 (×4): 1000 ug via INTRAMUSCULAR

## 2024-04-01 NOTE — Patient Instructions (Addendum)
 It was very nice to meet you today, as dicussed with will plan for the following :  1) Upper endoscopy   2) Protonix suspension after endoscopy

## 2024-04-01 NOTE — Telephone Encounter (Signed)
 PA approved via [facility] Auth# [id number] DOS: Apr 07, 2024 - Jul 06, 2024

## 2024-04-01 NOTE — Assessment & Plan Note (Signed)
 Chronic and unchanged.  I referred him to psychiatry last month but having difficulty accessing the psychiatry appointment for him.  I am going to place another referral to a behavioral health center in [place] that they are interested in.  No safety concerns today on exam.  Overall his mood seems stable.

## 2024-04-01 NOTE — Progress Notes (Signed)
 Andromeda Poppen Faizan Gabrelle Roca , M.D. Gastroenterology & Hepatology [facility] Gastroenterology [address] [place], [place] [address] Primary Care Physician: Jerrell Cleatus Ned, MD [address] [place] [address]  Chief Complaint: Nausea and vomiting, difficulty swallowing  History of Present Illness:  Joshua Hurst is a 28 y.o. male with developmental delay who presents for evaluation of nausea, vomiting, dysphagia with unintentional weight loss   Most of the history is obtained by father as patient is not speaking much. Patient reports food getting stuck in throat and middle of the chest.  Reports 20 pound weight loss in the last 6 months which has been unintentional And CT demonstrated pneumomediastinum followed esophagogram ruled out esophageal perforation . This happened in setting of excessive retching and vomiting .  Patient is scared to take any pills and have not had a trial of PPI.  Although reports in 2021 with upper endoscopy and dilation his symptoms did improve at that time  Most recent labs with normal liver enzymes, B12 deficiency:163.  Hemoglobin 16 platelet 219.  Normal total IgA and negative celiac Hemoglobin A1c 4.8 TSH 1.89  Percent saturation 16, ferritin 138  Last EGD:2021  - Esophagogastric landmarks identified. - Normal esophagus otherwise - empiric dilation performed and biopsies obtained as above - Normal stomach. Biopsied. - Suspected minor papilla in the duodenum. Biopsied to rule out adenomatous change. - Normal duodenum oherwise.  1. Surgical [P], duodenal nodule - GASTRIC HETEROTOPIA 2. 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 Surgical [P], esophagus - ESOPHAGEAL SQUAMOUS MUCOSA WITH VASCULAR CONGESTION, AND SQUAMOUS BALLOONING, SUGGESTIVE OF REFLUX ESOPHAGITIS - NEGATIVE FOR INCREASED INTRAEPITHELIAL EOSINOPHILS  Last Colonoscopy:none  FHx: neg for any gastrointestinal/liver disease, no malignancies Social: neg smoking, alcohol or illicit drug use Surgical: no abdominal surgeries  Past Medical History: Past Medical History:  Diagnosis Date   Acid reflux    Allergy    Spring allergies   Development delay     Past Surgical History:History reviewed. No pertinent surgical history.  Family History: Family History  Problem Relation Age of Onset   Hypertension Father     Social History: Social History   Tobacco Use  Smoking Status Never  Smokeless Tobacco Never   Social History   Substance and Sexual Activity  Alcohol Use No   Social History   Substance and Sexual Activity  Drug Use No    Allergies: Allergies  Allergen Reactions   Omnicef [Cefdinir] Rash   Penicillins Rash    Medications: Current Outpatient Medications  Medication Sig Dispense Refill   pantoprazole sodium (PROTONIX) 40 mg Take 40 mg by mouth daily. 30 each 2   No current facility-administered medications for this visit.    Review of Systems: GENERAL: negative for malaise, night sweats HEENT: No changes in hearing or vision, no nose bleeds or other nasal problems. NECK: Negative for lumps, goiter, pain and significant neck swelling RESPIRATORY: Negative for cough, wheezing CARDIOVASCULAR: Negative for chest pain, leg swelling, palpitations, orthopnea GI: SEE HPI MUSCULOSKELETAL: Negative for joint pain or swelling, back pain, and muscle pain. SKIN: Negative for lesions, rash HEMATOLOGY Negative for prolonged bleeding, bruising easily, and swollen nodes. ENDOCRINE: Negative for cold or heat intolerance, polyuria, polydipsia and goiter. NEURO:  negative for tremor, gait imbalance,  syncope and seizures. The remainder of the review of systems is noncontributory.   Physical Exam: BP 111/73   Pulse 71   Temp 97.8 F (36.6 C)   Ht 6' (1.829 m)   Wt 186 lb 6.4 oz (84.6 kg)   BMI 25.28 kg/m  GENERAL: The patient is AO x3, in no acute distress. HEENT: Head is normocephalic and atraumatic. EOMI are intact. Mouth is well hydrated and without lesions. NECK: Supple. No masses LUNGS: Clear to auscultation. No presence of rhonchi/wheezing/rales. Adequate chest expansion HEART: RRR, normal s1 and s2. ABDOMEN: Soft, nontender, no guarding, no peritoneal signs, and nondistended. BS +. No masses.   Imaging/Labs: as above     Latest Ref Rng & Units 03/04/2024   11:15 AM 02/27/2024    9:22 AM  CBC  WBC 4.0 - 10.5 K/uL 6.6  9.7   Hemoglobin 13.0 - 17.0 g/dL 83.9  85.2   Hematocrit 39.0 - 52.0 % 47.1  43.8   Platelets 150.0 - 400.0 K/uL 219.0  204    Lab Results  Component Value Date   IRON 52 03/04/2024   TIBC 323.4 03/04/2024   FERRITIN 138.5 03/04/2024    I personally reviewed and interpreted the available labs, imaging and endoscopic files. IMPRESSION: Normal water-soluble contrast esophagram. No extravasation was observed.    IMPRESSION: Soft tissue emphysema likely tracking from the chest, no airway swelling or perforation seen in the neck.   IMPRESSION: Extensive pneumomediastinum, usually barotrauma. There is no esophageal thickening and no extravasation of swallowed contrast.  Impression and Plan:  Joshua Hurst is a 28 y.o. male with developmental delay who presents for evaluation of nausea, vomiting, dysphagia with unintentional weight loss   #Dysphagia  #Unintentional weight loss #Nausea/vomiting  This could be uncontrolled GERD as biopsies from 2021 suggest reflux changes to the esophagus  Although young age with unintentional weight loss and solid food dysphagia eosinophilic esophagitis remains on differential  Patient had improvement  in his symptoms after last esophageal dilation in 2021  This also could be esophageal dysmotility disorder  At this time would recommend upper endoscopy with proximal distal esophageal biopsies+/-dilation  Ideally would perform Bravo (wireless pH testing) at the time of endoscopy but given behavioral disorder would be challenging for patient to follow instructions for 96  Since patient has not been taking PPI as he is unable to swallow pills will give Protonix suspension  If patient symptoms persist despite above then will refer the patient for 24 hours pH impedance with esophageal manometry off PPI  #Vitamin b12 deficiency  Recent labs with vitamin B12 deficiency  Patient will be following with PCP, recommend sending workup for pernicious anemia with anti-intrinsic factor antibody antiparietal antibody with next blood work.  Started vitamin B12 supplementation  We will take mapping with biopsies during upper endoscopy to evaluate for atrophic autoimmune gastritis.  All questions were answered.      Farooq Petrovich Faizan Peityn Payton, MD Gastroenterology and Hepatology [facility] Gastroenterology   This chart has been completed using [facility] Dictation software, and while attempts have been made to ensure accuracy , certain words and phrases may not be transcribed as intended

## 2024-04-01 NOTE — H&P (View-Only) (Signed)
 Andromeda Poppen Faizan Gabrelle Roca , M.D. Gastroenterology & Hepatology [facility] Gastroenterology [address] [place], [place] [address] Primary Care Physician: Jerrell Cleatus Ned, MD [address] [place] [address]  Chief Complaint: Nausea and vomiting, difficulty swallowing  History of Present Illness:  Joshua Hurst is a 28 y.o. male with developmental delay who presents for evaluation of nausea, vomiting, dysphagia with unintentional weight loss   Most of the history is obtained by father as patient is not speaking much. Patient reports food getting stuck in throat and middle of the chest.  Reports 20 pound weight loss in the last 6 months which has been unintentional And CT demonstrated pneumomediastinum followed esophagogram ruled out esophageal perforation . This happened in setting of excessive retching and vomiting .  Patient is scared to take any pills and have not had a trial of PPI.  Although reports in 2021 with upper endoscopy and dilation his symptoms did improve at that time  Most recent labs with normal liver enzymes, B12 deficiency:163.  Hemoglobin 16 platelet 219.  Normal total IgA and negative celiac Hemoglobin A1c 4.8 TSH 1.89  Percent saturation 16, ferritin 138  Last EGD:2021  - Esophagogastric landmarks identified. - Normal esophagus otherwise - empiric dilation performed and biopsies obtained as above - Normal stomach. Biopsied. - Suspected minor papilla in the duodenum. Biopsied to rule out adenomatous change. - Normal duodenum oherwise.  1. Surgical [P], duodenal nodule - GASTRIC HETEROTOPIA 2. 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 Surgical [P], esophagus - ESOPHAGEAL SQUAMOUS MUCOSA WITH VASCULAR CONGESTION, AND SQUAMOUS BALLOONING, SUGGESTIVE OF REFLUX ESOPHAGITIS - NEGATIVE FOR INCREASED INTRAEPITHELIAL EOSINOPHILS  Last Colonoscopy:none  FHx: neg for any gastrointestinal/liver disease, no malignancies Social: neg smoking, alcohol or illicit drug use Surgical: no abdominal surgeries  Past Medical History: Past Medical History:  Diagnosis Date   Acid reflux    Allergy    Spring allergies   Development delay     Past Surgical History:History reviewed. No pertinent surgical history.  Family History: Family History  Problem Relation Age of Onset   Hypertension Father     Social History: Social History   Tobacco Use  Smoking Status Never  Smokeless Tobacco Never   Social History   Substance and Sexual Activity  Alcohol Use No   Social History   Substance and Sexual Activity  Drug Use No    Allergies: Allergies  Allergen Reactions   Omnicef [Cefdinir] Rash   Penicillins Rash    Medications: Current Outpatient Medications  Medication Sig Dispense Refill   pantoprazole sodium (PROTONIX) 40 mg Take 40 mg by mouth daily. 30 each 2   No current facility-administered medications for this visit.    Review of Systems: GENERAL: negative for malaise, night sweats HEENT: No changes in hearing or vision, no nose bleeds or other nasal problems. NECK: Negative for lumps, goiter, pain and significant neck swelling RESPIRATORY: Negative for cough, wheezing CARDIOVASCULAR: Negative for chest pain, leg swelling, palpitations, orthopnea GI: SEE HPI MUSCULOSKELETAL: Negative for joint pain or swelling, back pain, and muscle pain. SKIN: Negative for lesions, rash HEMATOLOGY Negative for prolonged bleeding, bruising easily, and swollen nodes. ENDOCRINE: Negative for cold or heat intolerance, polyuria, polydipsia and goiter. NEURO:  negative for tremor, gait imbalance,  syncope and seizures. The remainder of the review of systems is noncontributory.   Physical Exam: BP 111/73   Pulse 71   Temp 97.8 F (36.6 C)   Ht 6' (1.829 m)   Wt 186 lb 6.4 oz (84.6 kg)   BMI 25.28 kg/m  GENERAL: The patient is AO x3, in no acute distress. HEENT: Head is normocephalic and atraumatic. EOMI are intact. Mouth is well hydrated and without lesions. NECK: Supple. No masses LUNGS: Clear to auscultation. No presence of rhonchi/wheezing/rales. Adequate chest expansion HEART: RRR, normal s1 and s2. ABDOMEN: Soft, nontender, no guarding, no peritoneal signs, and nondistended. BS +. No masses.   Imaging/Labs: as above     Latest Ref Rng & Units 03/04/2024   11:15 AM 02/27/2024    9:22 AM  CBC  WBC 4.0 - 10.5 K/uL 6.6  9.7   Hemoglobin 13.0 - 17.0 g/dL 83.9  85.2   Hematocrit 39.0 - 52.0 % 47.1  43.8   Platelets 150.0 - 400.0 K/uL 219.0  204    Lab Results  Component Value Date   IRON 52 03/04/2024   TIBC 323.4 03/04/2024   FERRITIN 138.5 03/04/2024    I personally reviewed and interpreted the available labs, imaging and endoscopic files. IMPRESSION: Normal water-soluble contrast esophagram. No extravasation was observed.    IMPRESSION: Soft tissue emphysema likely tracking from the chest, no airway swelling or perforation seen in the neck.   IMPRESSION: Extensive pneumomediastinum, usually barotrauma. There is no esophageal thickening and no extravasation of swallowed contrast.  Impression and Plan:  Joshua Hurst is a 28 y.o. male with developmental delay who presents for evaluation of nausea, vomiting, dysphagia with unintentional weight loss   #Dysphagia  #Unintentional weight loss #Nausea/vomiting  This could be uncontrolled GERD as biopsies from 2021 suggest reflux changes to the esophagus  Although young age with unintentional weight loss and solid food dysphagia eosinophilic esophagitis remains on differential  Patient had improvement  in his symptoms after last esophageal dilation in 2021  This also could be esophageal dysmotility disorder  At this time would recommend upper endoscopy with proximal distal esophageal biopsies+/-dilation  Ideally would perform Bravo (wireless pH testing) at the time of endoscopy but given behavioral disorder would be challenging for patient to follow instructions for 96  Since patient has not been taking PPI as he is unable to swallow pills will give Protonix suspension  If patient symptoms persist despite above then will refer the patient for 24 hours pH impedance with esophageal manometry off PPI  #Vitamin b12 deficiency  Recent labs with vitamin B12 deficiency  Patient will be following with PCP, recommend sending workup for pernicious anemia with anti-intrinsic factor antibody antiparietal antibody with next blood work.  Started vitamin B12 supplementation  We will take mapping with biopsies during upper endoscopy to evaluate for atrophic autoimmune gastritis.  All questions were answered.      Farooq Petrovich Faizan Peityn Payton, MD Gastroenterology and Hepatology [facility] Gastroenterology   This chart has been completed using [facility] Dictation software, and while attempts have been made to ensure accuracy , certain words and phrases may not be transcribed as intended

## 2024-04-01 NOTE — Assessment & Plan Note (Signed)
 New diagnosis of vitamin B12 deficiency.  B12 level was low at 163 on last visit.  This is most likely due to nutritional deficiency in the setting of unintentional weight loss, esophageal dysphagia.  No plans to check intrinsic factors, I suspect the patient is going to need B12 supplementation until we can stabilize his nutrition.  Because he is unable to take pills reliably, we gave him first dose of IM B12 1000 mcg.  Will plan for weekly dosing over the next 4 weeks, and then can space out to monthly.  If his endoscopy is successful and he is able to more reliably taken pills, can switch over to an oral B12 supplement.

## 2024-04-01 NOTE — Progress Notes (Signed)
 Established Patient Office Visit  Subjective   Patient ID: Joshua Hurst, male    DOB: Aug 02, 1996  Age: 28 y.o. MRN: [id number]  Chief Complaint  Patient presents with   Medical Management of Chronic Issues    4 week follow up     HPI  28 year old person here for management of unintentional weight loss and esophageal dysphagia.  Reports doing okay over the last 4 weeks since I last saw him.  He has been able to eat soft foods like mashed potatoes, pancakes, meatballs.  Weight is up 2 pounds since our last visit.  Still having intermittent nausea and vomiting, vomiting almost on a daily basis at this point still.  Not able to take tablets.  Reports feeling tired a lot.  Has some somatic concerns about ear fullness, ringing in his ears, pain in the scrotum.  Denies depressed mood or anxious mood.  Had a good consultation this morning with gastroenterologist and planning for endoscopy next week.  Last month he had a pneumomediastinum which was spontaneous in the setting of retching.  Chest pain has resolved.      Objective:     BP 121/72   Pulse 68   Wt 186 lb (84.4 kg)   BMI 25.23 kg/m    Physical Exam  Gen: Well-appearing man Psych: Withdrawn, no eye contact, says very little, answers direct questions, not anxious or depressed appearing, not obviously responding to internal stimuli. Ears: Bilateral tympanic membranes are normal, no cerumen impaction Neck: Normal thyroid, no lymphadenopathy, no crepitus at the lower neck or upper chest Heart: Regular, no murmurs or rubs Lungs: Unlabored, clear throughout Genitals: Normal-appearing penis, circumcised, normal scrotum, normal testicles, normal epididymis, no masses or growths, no erythema or swelling, no tenderness on elevation or palpation    Assessment & Plan:   Problem List Items Addressed This Visit       High   Disorder of affect (HCC) (Chronic)   Chronic and unchanged.  I referred him to psychiatry last month but  having difficulty accessing the psychiatry appointment for him.  I am going to place another referral to a behavioral health center in [place] that they are interested in.  No safety concerns today on exam.  Overall his mood seems stable.      Unintentional weight loss - Primary (Chronic)   Chronic and stable.  Weight today 186 pounds up 2 pounds from when I saw him last month.  Etiology is likely due to esophageal dysphagia.  He consulted with GI today and is planning for endoscopy next Thursday.  May need esophageal dilation if he has a stricture in place.  Hopefully after that procedure he can more easily take him high-quality calories.        Medium    Vitamin B12 deficiency (Chronic)   New diagnosis of vitamin B12 deficiency.  B12 level was low at 163 on last visit.  This is most likely due to nutritional deficiency in the setting of unintentional weight loss, esophageal dysphagia.  No plans to check intrinsic factors, I suspect the patient is going to need B12 supplementation until we can stabilize his nutrition.  Because he is unable to take pills reliably, we gave him first dose of IM B12 1000 mcg.  Will plan for weekly dosing over the next 4 weeks, and then can space out to monthly.  If his endoscopy is successful and he is able to more reliably taken pills, can switch over to an oral B12  supplement.        Low   Somatic complaints, multiple   Chronic issue of multiple somatic complaints at each visit, likely part of his affect disorder.  Today he had concerns about something clogging his ears, on his exam this ears appear normal with normal tympanic membranes.  He had discomfort in his scrotum, on exam scrotum and testicles appear normal.  Will check a UA.      Other Visit Diagnoses       Pain in testicle, unspecified laterality       Relevant Orders   POCT urinalysis dipstick       Return in about 2 months (around 06/01/2024).    Cleatus Debby Specking, MD

## 2024-04-01 NOTE — Assessment & Plan Note (Signed)
 Chronic and stable.  Weight today 186 pounds up 2 pounds from when I saw him last month.  Etiology is likely due to esophageal dysphagia.  He consulted with GI today and is planning for endoscopy next Thursday.  May need esophageal dilation if he has a stricture in place.  Hopefully after that procedure he can more easily take him high-quality calories.

## 2024-04-01 NOTE — Assessment & Plan Note (Signed)
 Chronic issue of multiple somatic complaints at each visit, likely part of his affect disorder.  Today he had concerns about something clogging his ears, on his exam this ears appear normal with normal tympanic membranes.  He had discomfort in his scrotum, on exam scrotum and testicles appear normal.  Will check a UA.

## 2024-04-05 ENCOUNTER — Encounter (HOSPITAL_COMMUNITY): Payer: Self-pay

## 2024-04-05 ENCOUNTER — Other Ambulatory Visit: Payer: Self-pay

## 2024-04-05 ENCOUNTER — Encounter (HOSPITAL_COMMUNITY)
Admission: RE | Admit: 2024-04-05 | Discharge: 2024-04-05 | Disposition: A | Discharge status: Home / Self Care | Source: Ambulatory Visit | Attending: Gastroenterology | Admitting: Gastroenterology

## 2024-04-05 NOTE — Pre-Procedure Instructions (Signed)
 Pre-op phone call done with patient and father. Father would have to prompt patient to answer questions, before patient would answer correctly. Dad will need to be present when in pre-op. According to PCP history, patient is very fearful of many things and is seeking a psyche consult.

## 2024-04-07 ENCOUNTER — Ambulatory Visit (HOSPITAL_COMMUNITY)

## 2024-04-07 ENCOUNTER — Encounter (HOSPITAL_COMMUNITY)
Admission: RE | Disposition: A | Discharge status: Home / Self Care | Payer: Self-pay | Source: Home / Self Care | Attending: Gastroenterology

## 2024-04-07 ENCOUNTER — Ambulatory Visit (HOSPITAL_COMMUNITY)
Admission: RE | Admit: 2024-04-07 | Discharge: 2024-04-07 | Disposition: A | Discharge status: Home / Self Care | Attending: Gastroenterology | Admitting: Gastroenterology

## 2024-04-07 ENCOUNTER — Encounter (HOSPITAL_COMMUNITY): Payer: Self-pay | Admitting: Gastroenterology

## 2024-04-07 DIAGNOSIS — K2289 Other specified disease of esophagus: Secondary | ICD-10-CM

## 2024-04-07 DIAGNOSIS — R1310 Dysphagia, unspecified: Secondary | ICD-10-CM | POA: Diagnosis not present

## 2024-04-07 DIAGNOSIS — Z79899 Other long term (current) drug therapy: Secondary | ICD-10-CM | POA: Insufficient documentation

## 2024-04-07 DIAGNOSIS — R634 Abnormal weight loss: Secondary | ICD-10-CM | POA: Insufficient documentation

## 2024-04-07 DIAGNOSIS — E538 Deficiency of other specified B group vitamins: Secondary | ICD-10-CM | POA: Diagnosis not present

## 2024-04-07 DIAGNOSIS — K2970 Gastritis, unspecified, without bleeding: Secondary | ICD-10-CM

## 2024-04-07 DIAGNOSIS — R6250 Unspecified lack of expected normal physiological development in childhood: Secondary | ICD-10-CM | POA: Insufficient documentation

## 2024-04-07 DIAGNOSIS — R112 Nausea with vomiting, unspecified: Secondary | ICD-10-CM | POA: Diagnosis not present

## 2024-04-07 DIAGNOSIS — K2100 Gastro-esophageal reflux disease with esophagitis, without bleeding: Secondary | ICD-10-CM | POA: Diagnosis not present

## 2024-04-07 DIAGNOSIS — K219 Gastro-esophageal reflux disease without esophagitis: Secondary | ICD-10-CM | POA: Diagnosis not present

## 2024-04-07 DIAGNOSIS — K3189 Other diseases of stomach and duodenum: Secondary | ICD-10-CM | POA: Diagnosis not present

## 2024-04-07 DIAGNOSIS — Z6825 Body mass index (BMI) 25.0-25.9, adult: Secondary | ICD-10-CM | POA: Insufficient documentation

## 2024-04-07 HISTORY — PX: ESOPHAGOGASTRODUODENOSCOPY: SHX5428

## 2024-04-07 HISTORY — PX: ESOPHAGEAL DILATION: SHX303

## 2024-04-07 SURGERY — EGD (ESOPHAGOGASTRODUODENOSCOPY)
Anesthesia: General

## 2024-04-07 MED ORDER — LIDOCAINE 2% (20 MG/ML) 5 ML SYRINGE
INTRAMUSCULAR | Status: AC
  Filled 2024-04-07: qty 5

## 2024-04-07 MED ORDER — MIDAZOLAM HCL 2 MG/2ML IJ SOLN
INTRAMUSCULAR | Status: AC
  Filled 2024-04-07: qty 2

## 2024-04-07 MED ORDER — LACTATED RINGERS IV SOLN: INTRAVENOUS | Status: DC

## 2024-04-07 MED ORDER — MIDAZOLAM HCL 2 MG/2ML IJ SOLN
INTRAMUSCULAR | Status: DC | PRN
  Administered 2024-04-07: 2 mg via INTRAVENOUS

## 2024-04-07 MED ORDER — PROPOFOL 500 MG/50ML IV EMUL
INTRAVENOUS | Status: DC | PRN
  Administered 2024-04-07: 150 ug/kg/min via INTRAVENOUS

## 2024-04-07 MED ORDER — LACTATED RINGERS IV SOLN: INTRAVENOUS | Status: DC | PRN

## 2024-04-07 MED ORDER — LIDOCAINE 2% (20 MG/ML) 5 ML SYRINGE
INTRAMUSCULAR | Status: DC | PRN
  Administered 2024-04-07: 50 mg via INTRAVENOUS

## 2024-04-07 NOTE — Anesthesia Preprocedure Evaluation (Addendum)
 Anesthesia Evaluation  Patient identified by MRN, date of birth, ID band Patient awake    Reviewed: Allergy & Precautions, H&P , NPO status , Patient's Chart, lab work & pertinent test results  Airway Mallampati: I  TM Distance: >3 FB Neck ROM: Full    Dental no notable dental hx.    Pulmonary neg pulmonary ROS   Pulmonary exam normal breath sounds clear to auscultation       Cardiovascular negative cardio ROS Normal cardiovascular exam Rhythm:Regular Rate:Normal     Neuro/Psych  PSYCHIATRIC DISORDERS      Developmentally delayed    GI/Hepatic Neg liver ROS,GERD  ,,  Endo/Other  negative endocrine ROS    Renal/GU negative Renal ROS  negative genitourinary   Musculoskeletal negative musculoskeletal ROS (+)    Abdominal   Peds negative pediatric ROS (+)  Hematology negative hematology ROS (+)   Anesthesia Other Findings   Reproductive/Obstetrics negative OB ROS                              Anesthesia Physical Anesthesia Plan  ASA: 2  Anesthesia Plan: General   Post-op Pain Management:    Induction: Intravenous  PONV Risk Score and Plan: Propofol infusion  Airway Management Planned: Nasal Cannula  Additional Equipment:   Intra-op Plan:   Post-operative Plan:   Informed Consent: I have reviewed the patients History and Physical, chart, labs and discussed the procedure including the risks, benefits and alternatives for the proposed anesthesia with the patient or authorized representative who has indicated his/her understanding and acceptance.     Dental advisory given  Plan Discussed with: CRNA  Anesthesia Plan Comments:          Anesthesia Quick Evaluation

## 2024-04-07 NOTE — Interval H&P Note (Signed)
 History and Physical Interval Note:  04/07/2024 11:16 AM  Joshua Hurst  has presented today for surgery, with the diagnosis of dysphagia.  The various methods of treatment have been discussed with the patient and family. After consideration of risks, benefits and other options for treatment, the patient has consented to  Procedure(s) with comments: EGD (ESOPHAGOGASTRODUODENOSCOPY) (N/A) - 1230pm, asa 1-2, unable to reach pt to move up DILATION, ESOPHAGUS (N/A) as a surgical intervention.  The patient's history has been reviewed, patient examined, no change in status, stable for surgery.  I have reviewed the patient's chart and labs.  Questions were answered to the patient's satisfaction.     Deatrice FALCON Donzel Romack

## 2024-04-07 NOTE — Discharge Instructions (Signed)

## 2024-04-07 NOTE — Anesthesia Postprocedure Evaluation (Signed)
 Anesthesia Post Note  Patient: Joshua Hurst  Procedure(s) Performed: EGD (ESOPHAGOGASTRODUODENOSCOPY) DILATION, ESOPHAGUS  Patient location during evaluation: PACU Anesthesia Type: General Level of consciousness: awake and alert Pain management: pain level controlled Vital Signs Assessment: post-procedure vital signs reviewed and stable Respiratory status: spontaneous breathing, nonlabored ventilation, respiratory function stable and patient connected to nasal cannula oxygen Cardiovascular status: stable and blood pressure returned to baseline Postop Assessment: no apparent nausea or vomiting Anesthetic complications: no   No notable events documented.   Last Vitals:  Vitals:   04/07/24 1149 04/07/24 1206  BP: (!) 91/43 112/64  Pulse: (!) 55 63  Resp: 19 (!) 24  Temp: (!) 36.4 C   SpO2: 97%     Last Pain:  Vitals:   04/07/24 1149  TempSrc: Oral  PainSc: Asleep                 Andrea Limes

## 2024-04-07 NOTE — Op Note (Signed)
 [facility] Patient Name: Joshua Hurst Procedure Date: 04/07/2024 11:10 AM MRN: [id number] Date of Birth: March 14, 1996 Attending MD: Deatrice Dine , MD, [id number] CSN: [id number] Age: 28 Admit Type: Outpatient Procedure:                Upper GI endoscopy Indications:              Dysphagia Providers:                Deatrice Dine, MD, Rosina Sprague, Leandrew Edelman RN,                            RN, Gordy Lonni Balm, Technician Referring MD:              Medicines:                Monitored Anesthesia Care Complications:            No immediate complications. Estimated Blood Loss:     Estimated blood loss was minimal. Procedure:                Pre-Anesthesia Assessment:                           - Prior to the procedure, a History and Physical                            was performed, and patient medications and                            allergies were reviewed. The patient's tolerance of                            previous anesthesia was also reviewed. The risks                            and benefits of the procedure and the sedation                            options and risks were discussed with the patient.                            All questions were answered, and informed consent                            was obtained. Prior Anticoagulants: The patient has                            taken no anticoagulant or antiplatelet agents                            except for aspirin. ASA Grade Assessment: II - A                            patient with mild systemic disease. After reviewing  the risks and benefits, the patient was deemed in                            satisfactory condition to undergo the procedure.                           After obtaining informed consent, the endoscope was                            passed under direct vision. Throughout the                            procedure, the patient's blood pressure, pulse, and                             oxygen saturations were monitored continuously. The                            [id number]-H190 ([id number]) scope was introduced through the                            mouth, and advanced to the second part of duodenum.                            The upper GI endoscopy was accomplished without                            difficulty. The patient tolerated the procedure                            well. Scope In: 11:32:51 AM Scope Out: 11:43:33 AM Total Procedure Duration: 0 hours 10 minutes 42 seconds  Findings:      No endoscopic abnormality was evident in the esophagus to explain the       patient's complaint of dysphagia. It was decided, however, to proceed       with dilation of the entire esophagus. A TTS dilator was passed through       the scope. Dilation with an 18-19-20 mm balloon dilator was performed to       20 mm. The dilation site was examined following endoscope reinsertion       and showed no change. Biopsies were obtained from the proximal and       distal esophagus with cold forceps for histology of suspected       eosinophilic esophagitis.      Mildly erythematous mucosa without bleeding was found in the gastric       antrum. Biopsies were taken with a cold forceps for histology.      The duodenal bulb and second portion of the duodenum were normal. Impression:               - No endoscopic esophageal abnormality to explain                            patient's dysphagia. Esophagus dilated. Dilated.                           -  Erythematous mucosa in the antrum. Biopsied.                           - Normal duodenal bulb and second portion of the                            duodenum.                           - Biopsies were taken with a cold forceps for                            evaluation of eosinophilic esophagitis. Moderate Sedation:      Per Anesthesia Care Recommendation:           - Patient has a contact number available for                            emergencies. The  signs and symptoms of potential                            delayed complications were discussed with the                            patient. Return to normal activities tomorrow.                            Written discharge instructions were provided to the                            patient.                           - Resume previous diet.                           - Continue present medications.                           - Await pathology results.                           -If dysphagia persist may obtain HRM with 24 hour                            pH impedence ( OFF PPI) Procedure Code(s):        --- Professional ---                           43249, 59, Esophagogastroduodenoscopy, flexible,                            transoral; with transendoscopic balloon dilation of                            esophagus (less than 30 mm diameter)  56760, 59, Esophagogastroduodenoscopy, flexible,                            transoral; with biopsy, single or multiple Diagnosis Code(s):        --- Professional ---                           R13.10, Dysphagia, unspecified                           K31.89, Other diseases of stomach and duodenum CPT copyright 2022 American [employer] Association. All rights reserved. The codes documented in this report are preliminary and upon coder review may  be revised to meet current compliance requirements. Deatrice Dine, MD Deatrice Dine, MD 04/07/2024 11:48:18 AM This report has been signed electronically. Number of Addenda: 0

## 2024-04-07 NOTE — Transfer of Care (Signed)
 Immediate Anesthesia Transfer of Care Note  Patient: Joshua JONELLE Hurst  Procedure(s) Performed: EGD (ESOPHAGOGASTRODUODENOSCOPY) DILATION, ESOPHAGUS  Patient Location: Short Stay  Anesthesia Type:MAC  Level of Consciousness: drowsy  Airway & Oxygen Therapy: Patient Spontanous Breathing  Post-op Assessment: Report given to RN and Post -op Vital signs reviewed and stable  Post vital signs: Reviewed and stable  Last Vitals:  Vitals Value Taken Time  BP 91/43 04/07/24 11:50  Temp    Pulse 54 04/07/24 11:50  Resp 15 04/07/24 11:50  SpO2 98% on RA 04/07/24 11:50    Last Pain:  Vitals:   04/07/24 1124  PainSc: 0-No pain         Complications: No notable events documented.

## 2024-04-11 ENCOUNTER — Encounter (HOSPITAL_COMMUNITY): Payer: Self-pay | Admitting: Gastroenterology

## 2024-04-11 LAB — SURGICAL PATHOLOGY

## 2024-04-13 ENCOUNTER — Ambulatory Visit (INDEPENDENT_AMBULATORY_CARE_PROVIDER_SITE_OTHER): Payer: Self-pay | Admitting: Gastroenterology

## 2024-04-14 NOTE — Progress Notes (Signed)
Patient result letter mailed procedure note and pathology result faxed to PCP

## 2024-04-15 ENCOUNTER — Ambulatory Visit (INDEPENDENT_AMBULATORY_CARE_PROVIDER_SITE_OTHER)

## 2024-04-15 DIAGNOSIS — E538 Deficiency of other specified B group vitamins: Secondary | ICD-10-CM | POA: Diagnosis not present

## 2024-04-15 NOTE — Progress Notes (Signed)
 Joshua Hurst is a 28 y.o. male presents to the office today for cyanocobalamin  1,000 mcg injection per physician's orders. Injection was administered Intramuscular Left deltoid.   Patient's next injection due 1 week , appt made? yes  [name]

## 2024-04-18 ENCOUNTER — Ambulatory Visit (INDEPENDENT_AMBULATORY_CARE_PROVIDER_SITE_OTHER): Admitting: Gastroenterology

## 2024-04-22 ENCOUNTER — Encounter (INDEPENDENT_AMBULATORY_CARE_PROVIDER_SITE_OTHER): Payer: Self-pay | Admitting: Gastroenterology

## 2024-04-22 ENCOUNTER — Ambulatory Visit

## 2024-04-22 ENCOUNTER — Ambulatory Visit (INDEPENDENT_AMBULATORY_CARE_PROVIDER_SITE_OTHER): Admitting: Gastroenterology

## 2024-04-22 VITALS — BP 122/82 | HR 77 | Temp 98.0°F | Ht 72.0 in | Wt 190.8 lb

## 2024-04-22 DIAGNOSIS — R1310 Dysphagia, unspecified: Secondary | ICD-10-CM | POA: Diagnosis not present

## 2024-04-22 DIAGNOSIS — E538 Deficiency of other specified B group vitamins: Secondary | ICD-10-CM

## 2024-04-22 DIAGNOSIS — K921 Melena: Secondary | ICD-10-CM | POA: Insufficient documentation

## 2024-04-22 DIAGNOSIS — K219 Gastro-esophageal reflux disease without esophagitis: Secondary | ICD-10-CM

## 2024-04-22 DIAGNOSIS — R1319 Other dysphagia: Secondary | ICD-10-CM

## 2024-04-22 MED ORDER — HYDROCORTISONE ACETATE 25 MG RE SUPP: 25.0000 mg | Freq: Two times a day (BID) | RECTAL | 0 refills | Status: DC

## 2024-04-22 MED ORDER — POLYETHYLENE GLYCOL 3350 17 G PO PACK
17.0000 g | PACK | Freq: Two times a day (BID) | ORAL | 0 refills | Status: AC
Start: 2024-04-22 — End: 2024-07-21

## 2024-04-22 NOTE — Progress Notes (Signed)
 Calleen Alvis Faizan Sukari Grist , M.D. Gastroenterology & Hepatology [facility] [address] [place], [place] [address] Primary Care Physician: Jerrell Cleatus Ned, MD [address] [place] [address]  Chief Complaint: Dysphagia, hematochezia History of Present Illness:  Joshua Hurst is a 28 y.o. male with developmental delay who is here for follow-up of nausea, vomiting, dysphagia and hematochezia  Patient was seen last month where he underwent upper endoscopy with dilation.  Biopsies negative for EOE but positive for reflux  Most of the history is obtained by father as patient is not speaking much.  He reports that after starting pantoprazole  patient has been eating excessively for example he ate 6 hotdogs yesterday.  Previously patient reported dysphagia . CT previously demonstrated pneumomediastinum followed esophagogram ruled out esophageal perforation . This happened in setting of excessive retching and vomiting .  Today he presents with a new complaint seeing fresh blood intermittently upon wiping, reports bowel movements are Bristol stool scale type IV every day without much straining.  Patient has lower abdominal intermittent discomfort since starting antibiotics for dental work  Most recent labs with normal liver enzymes, B12 deficiency:163.  Hemoglobin 16 platelet 219.  Normal total IgA and negative celiac Hemoglobin A1c 4.8 TSH 1.89  Percent saturation 16, ferritin 138  Last EGD: 2025  - No endoscopic esophageal abnormality to explain patient' s dysphagia. Esophagus dilated. Dilated. - Erythematous mucosa in the antrum. Biopsied. - Normal duodenal bulb and second portion of the duodenum. - Biopsies were taken with a cold forceps for evaluation of eosinophilic esophagitis.   A. STOMACH BIOPSY:  - Gastric antral mucosa with no specific histopathologic changes  - Helicobacter pylori-like organisms are not identified on routine HE   stain   B. ESOPHAGEAL BIOPSY:  - Esophageal squamous mucosa with mild vascular congestion, and focal  squamous ballooning, suggestive of reflux esophagitis  - Negative for increased intraepithelial eosinophils  2021  - Esophagogastric landmarks identified. - Normal esophagus otherwise - empiric dilation performed and biopsies obtained as above - Normal stomach. Biopsied. - Suspected minor papilla in the duodenum. Biopsied to rule out adenomatous change. - Normal duodenum oherwise.  1. Surgical [P], duodenal nodule - GASTRIC HETEROTOPIA 2. 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 Surgical [P], gastric antrum and body - GASTRIC ANTRAL MUCOSA WITH MILD NONSPECIFIC REACTIVE GASTROPATHY - GASTRIC OXYNTIC MUCOSA WITH NO SPECIFIC HISTOPATHOLOGIC CHANGES - WARTHIN STARRY STAIN IS NEGATIVE FOR HELICOBACTER PYLORI 4. Surgical [P], esophagus - ESOPHAGEAL SQUAMOUS MUCOSA WITH VASCULAR CONGESTION, AND SQUAMOUS BALLOONING, SUGGESTIVE OF REFLUX ESOPHAGITIS - NEGATIVE FOR INCREASED INTRAEPITHELIAL EOSINOPHILS  Last Colonoscopy:none  FHx: neg for any gastrointestinal/liver disease, no malignancies Social: neg smoking, alcohol or illicit drug use Surgical: no abdominal surgeries  Past Medical History: Past Medical History:  Diagnosis Date   Acid reflux    Allergy    Spring allergies   Development delay     Past Surgical History: Past Surgical History:  Procedure Laterality Date   ESOPHAGEAL DILATION N/A 04/07/2024   Procedure: DILATION, ESOPHAGUS;  Surgeon: Cinderella Deatrice FALCON, MD;  Location: [facility];  Service: Endoscopy;  Laterality: N/A;   ESOPHAGOGASTRODUODENOSCOPY N/A 04/07/2024   Procedure: EGD (ESOPHAGOGASTRODUODENOSCOPY);  Surgeon: Cinderella Deatrice FALCON, MD;  Location: [facility];  Service: Endoscopy;  Laterality: N/A;  1230pm, asa 1-2, unable to reach pt to move up    Family History: Family History   Problem Relation Age of Onset  Hypertension Father     Social History: Social History   Tobacco Use  Smoking Status Never  Smokeless Tobacco Never   Social History   Substance and Sexual Activity  Alcohol Use No   Social History   Substance and Sexual Activity  Drug Use No    Allergies: Allergies  Allergen Reactions   Omnicef [Cefdinir] Rash   Penicillins Rash    Medications: Current Outpatient Medications  Medication Sig Dispense Refill   clindamycin  (CLEOCIN ) 300 MG capsule Take 300 mg by mouth every 6 (six) hours.     pantoprazole  sodium (PROTONIX ) 40 mg Take 40 mg by mouth daily. 30 each 2   Current Facility-Administered Medications  Medication Dose Route Frequency Provider Last Rate Last Admin   cyanocobalamin  (VITAMIN B12) injection 1,000 mcg  1,000 mcg Intramuscular Q7 days Vincent, Duncan Thomas, MD   1,000 mcg at 04/15/24 1542    Review of Systems: GENERAL: negative for malaise, night sweats HEENT: No changes in hearing or vision, no nose bleeds or other nasal problems. NECK: Negative for lumps, goiter, pain and significant neck swelling RESPIRATORY: Negative for cough, wheezing CARDIOVASCULAR: Negative for chest pain, leg swelling, palpitations, orthopnea GI: SEE HPI MUSCULOSKELETAL: Negative for joint pain or swelling, back pain, and muscle pain. SKIN: Negative for lesions, rash HEMATOLOGY Negative for prolonged bleeding, bruising easily, and swollen nodes. ENDOCRINE: Negative for cold or heat intolerance, polyuria, polydipsia and goiter. NEURO: negative for tremor, gait imbalance, syncope and seizures. The remainder of the review of systems is noncontributory.   Physical Exam: BP 122/82 (BP Location: Right Arm, Patient Position: Sitting, Cuff Size: Normal)   Pulse 77   Temp 98 F (36.7 C) (Temporal)   Ht 6' (1.829 m)   Wt 190 lb 12.8 oz (86.5 kg)   BMI 25.88 kg/m  GENERAL: The patient is AO x3, in no acute distress. HEENT: Head is  normocephalic and atraumatic. EOMI are intact. Mouth is well hydrated and without lesions. NECK: Supple. No masses LUNGS: Clear to auscultation. No presence of rhonchi/wheezing/rales. Adequate chest expansion HEART: RRR, normal s1 and s2. ABDOMEN: Soft, nontender, no guarding, no peritoneal signs, and nondistended. BS +. No masses.   Imaging/Labs: as above     Latest Ref Rng & Units 03/04/2024   11:15 AM 02/27/2024    9:22 AM  CBC  WBC 4.0 - 10.5 K/uL 6.6  9.7   Hemoglobin 13.0 - 17.0 g/dL 83.9  85.2   Hematocrit 39.0 - 52.0 % 47.1  43.8   Platelets 150.0 - 400.0 K/uL 219.0  204    Lab Results  Component Value Date   IRON 52 03/04/2024   TIBC 323.4 03/04/2024   FERRITIN 138.5 03/04/2024    I personally reviewed and interpreted the available labs, imaging and endoscopic files. IMPRESSION: Normal water-soluble contrast esophagram. No extravasation was observed.    IMPRESSION: Soft tissue emphysema likely tracking from the chest, no airway swelling or perforation seen in the neck.   IMPRESSION: Extensive pneumomediastinum, usually barotrauma. There is no esophageal thickening and no extravasation of swallowed contrast.  Impression and Plan:   Joshua Hurst is a 28 y.o. male with developmental delay who is here for follow-up of nausea, vomiting, dysphagia and hematochezia  #Hematochezia  Patient has painless intermittent hematochezia upon wiping.  This could be from anorectal lesion such as hemorrhoids but given young age and subjective weight loss need to rule out inflammatory bowel disease with ileo-colonoscopy  I discussed with father risk-benefit indication limitation to  colonoscopy to evaluate for the cause of hematochezia.  He would like to wait until next appointment and be treated medically for now  Anusol suppository for 6 days Ensure adequate fluid intake: Aim for 8 glasses of water daily. Follow a high fiber diet: Include foods such as dates, prunes,  pears, and kiwi. Use Metamucil twice a day. Reevaluate videofluoroscopy next appointment  #Dysphagia  #Nausea/vomiting  This is likely due to reflux.  Patient underwent upper endoscopy with dilation.  Biopsies negative for EOE but positive for reflux  Since starting PPI patient's symptoms have resolved and he is eating normally now  If patient symptoms persist despite above then will refer the patient for 24 hours pH impedance with esophageal manometry off PPI  #Vitamin b12 deficiency  Recent labs with vitamin B12 deficiency  Patient will be following with PCP, recommend sending workup for pernicious anemia with anti-intrinsic factor antibody antiparietal antibody with next blood work.  Started vitamin B12 supplementation  Recent EGD with biopsies negative for atrophic autoimmune gastritis.  All questions were answered.      Joshua Biggs Faizan Oswin Johal, MD Gastroenterology and Hepatology [facility] Gastroenterology   This chart has been completed using [facility] Dictation software, and while attempts have been made to ensure accuracy , certain words and phrases may not be transcribed as intended

## 2024-04-22 NOTE — Progress Notes (Signed)
 Joshua Hurst is a 28 y.o. male presents to the office today for cyanocobalamin   1,000 mcg injection per physician's orders. Injection was administered Intramuscular Left deltoid.   Patient's next injection due 1 week, appt made? yes  [place] R Reef Achterberg

## 2024-04-29 ENCOUNTER — Ambulatory Visit

## 2024-04-29 DIAGNOSIS — E538 Deficiency of other specified B group vitamins: Secondary | ICD-10-CM | POA: Diagnosis not present

## 2024-04-29 NOTE — Progress Notes (Signed)
 Joshua Hurst is a 28 y.o. male presents to the office today for B12 injection per physician's orders. Injection was administered Intramuscular Right deltoid.   Patient's next injection due 05/30/24, appt made? no. Patient has office visit 06/03/24. B12 injection will be completed then.  Alfredo DELENA Shope

## 2024-06-03 ENCOUNTER — Encounter: Payer: Self-pay | Admitting: Student in an Organized Health Care Education/Training Program

## 2024-06-03 ENCOUNTER — Ambulatory Visit (INDEPENDENT_AMBULATORY_CARE_PROVIDER_SITE_OTHER): Admitting: Student in an Organized Health Care Education/Training Program

## 2024-06-03 VITALS — BP 140/94 | HR 72 | Wt 192.0 lb

## 2024-06-03 DIAGNOSIS — R634 Abnormal weight loss: Secondary | ICD-10-CM | POA: Diagnosis not present

## 2024-06-03 DIAGNOSIS — R6889 Other general symptoms and signs: Secondary | ICD-10-CM | POA: Diagnosis not present

## 2024-06-03 DIAGNOSIS — F39 Unspecified mood [affective] disorder: Secondary | ICD-10-CM

## 2024-06-03 DIAGNOSIS — K219 Gastro-esophageal reflux disease without esophagitis: Secondary | ICD-10-CM | POA: Diagnosis not present

## 2024-06-03 DIAGNOSIS — E538 Deficiency of other specified B group vitamins: Secondary | ICD-10-CM

## 2024-06-03 MED ORDER — CYANOCOBALAMIN 1000 MCG/ML IJ SOLN
1000.0000 ug | Freq: Once | INTRAMUSCULAR | Status: AC
Start: 2024-06-03 — End: 2024-06-03
  Administered 2024-06-03: 1000 ug via INTRAMUSCULAR

## 2024-06-03 MED ORDER — VITAMIN B-12 1000 MCG PO TABS
1000.0000 ug | ORAL_TABLET | Freq: Every day | ORAL | 1 refills | Status: AC
Start: 2024-06-03 — End: ?

## 2024-06-03 NOTE — Progress Notes (Signed)
 Joshua Hurst is a 28 y.o. male presents to the office today for B12 Injection per physician's orders. Injection was administered Intramuscular Left deltoid.   Patient's next injection due 07/04/2024, appt made? no  Injections are Monthly   [place] R Fran Mcree

## 2024-06-03 NOTE — Assessment & Plan Note (Signed)
 He is not happy but denies significant depression or anxiety and is open to psychiatry services. Follow up with referral to psychiatry clinic.  Patient is very withdrawn and not very functional at home.  I have a high suspicion there is some kind of underlying psychiatric disorder that may improve with medication management.

## 2024-06-03 NOTE — Assessment & Plan Note (Signed)
 Currently receiving B12 injections and is open to oral supplementation. Switch to oral B12 supplementation, one pill daily.

## 2024-06-03 NOTE — Assessment & Plan Note (Signed)
 He has gained six pounds, is eating well, and experiences less frequent vomiting.  I think the etiology is most likely due to the severe esophageal reflux and the recent spontaneous pneumomediastinum from small esophageal tear.  This seems to be healing well and his nutrition seems to be improving.

## 2024-06-03 NOTE — Patient Instructions (Signed)
  VISIT SUMMARY: During your visit, we discussed your ongoing gastrointestinal symptoms, testicular pain, neck and hip pain, and overall mental health. We reviewed your recent endoscopy results and current medications, and we made some adjustments to your treatment plan.  YOUR PLAN: -GASTROESOPHAGEAL REFLUX DISEASE: Gastroesophageal reflux disease (GERD) is a condition where stomach acid frequently flows back into the tube connecting your mouth and stomach (esophagus). Your symptoms have improved with weight gain, and your endoscopy showed mild inflammation. Continue taking Protonix  once daily to manage your acid reflux.  -VITAMIN B12 DEFICIENCY: Vitamin B12 deficiency occurs when your body doesn't have enough of this essential vitamin, which is important for nerve function and the production of red blood cells. You will switch from B12 injections to taking one oral B12 supplement pill daily.  -TESTICULAR PAIN: You are experiencing persistent tight pain in your testicles, but no abnormalities were found on examination. Please report if the pain worsens.  -UNINTENTIONAL WEIGHT LOSS (IMPROVING): You have gained six pounds and are eating well, with less frequent vomiting. This indicates improvement in your previous unintentional weight loss.  -DEPRESSIVE SYMPTOMS: You mentioned not feeling happy, though you do not have significant depression or anxiety. We will refer you to the psychiatry clinic for further support.  INSTRUCTIONS: Please continue taking Protonix  once daily for your acid reflux. Switch to taking one oral B12 supplement pill daily instead of injections. Monitor your testicular pain and report if it worsens. Follow up with the psychiatry clinic as referred for additional support with your mental health.

## 2024-06-03 NOTE — Progress Notes (Signed)
 Noted and front desk is aware

## 2024-06-03 NOTE — Assessment & Plan Note (Signed)
 History of multiple somatic complaints.  Today he has concerns about neck pain, testicular pain, right leg pain, and right leg coming out of socket.  On exam all the structures are normal and do not seem to be limiting him.  I provided reassurance and will continue to monitor this supportively.

## 2024-06-03 NOTE — Progress Notes (Signed)
 Established Patient Office Visit  Subjective   Patient ID: Joshua Hurst, male    DOB: November 29, 1995  Age: 28 y.o. MRN: [id number]  Chief Complaint  Patient presents with   Medical Management of Chronic Issues    2 month follow up.      HPI  Discussed the use of AI scribe software for clinical note transcription with the patient, who gave verbal consent to proceed.  History of Present Illness Joshua Hurst is a 28 year old male who presents with ongoing gastrointestinal symptoms and testicular pain.  He has been experiencing ongoing issues with vomiting, though it is not occurring daily anymore. There has been a weight gain of six pounds since the last visit. An endoscopy performed on July 3rd showed no esophageal abnormalities to explain swallowing problems. The stomach appeared normal with slight erythema, and biopsies were negative for eosinophilic esophagitis and H. pylori. The esophagus showed inflammation on endoscopy. He is currently taking Protonix  once a day for acid reflux.  He mentions persistent testicular pain, describing it as a 'tight pain' and noting discomfort when using the bathroom, feeling 'tightness and slimy'.  He experiences neck and hip pain, describing the neck pain as 'stiff'. He also mentions a past issue with his leg that used to bother him nightly, though it is not currently problematic.  He has not been in contact with the psychiatry clinic and is unsure about any scheduled appointments. No significant issues with depression or anxiety, but he does not feel happy.  He is not currently taking a multivitamin and has not had issues swallowing pills recently.     Objective:     BP (!) 140/94   Pulse 72   Wt 192 lb (87.1 kg)   BMI 26.04 kg/m    Physical Exam  Gen: Withdrawn young man Psych: Withdrawn affect, does not make eye contact, answers questions directly but does not expound, moderately depressed appearing. Neck: Normal thyroid, no nodules  or adenopathy Heart: Regular, no murmur Lungs: Unlabored, clear throughout Genitals: Normal penis, normal scrotum, no pain with palpation of the testicles Ext: Warm, no edema, normal joints    Assessment & Plan:   Problem List Items Addressed This Visit       High   Disorder of affect (HCC) (Chronic)   He is not happy but denies significant depression or anxiety and is open to psychiatry services. Follow up with referral to psychiatry clinic.  Patient is very withdrawn and not very functional at home.  I have a high suspicion there is some kind of underlying psychiatric disorder that may improve with medication management.      Unintentional weight loss - Primary (Chronic)   He has gained six pounds, is eating well, and experiences less frequent vomiting.  I think the etiology is most likely due to the severe esophageal reflux and the recent spontaneous pneumomediastinum from small esophageal tear.  This seems to be healing well and his nutrition seems to be improving.        Medium    Vitamin B12 deficiency (Chronic)   Currently receiving B12 injections and is open to oral supplementation. Switch to oral B12 supplementation, one pill daily.      Relevant Medications   cyanocobalamin  (VITAMIN B12) 1000 MCG tablet     Low   Somatic complaints, multiple   History of multiple somatic complaints.  Today he has concerns about neck pain, testicular pain, right leg pain, and right leg coming out  of socket.  On exam all the structures are normal and do not seem to be limiting him.  I provided reassurance and will continue to monitor this supportively.        Unprioritized   Gastroesophageal reflux disease (Chronic)   Symptoms have improved with weight gain. Endoscopy shows mild erythema; biopsies are negative for eosinophilic esophagitis and H. pylori, consistent with reflux. Continue Protonix  once daily.       Return in about 3 months (around 09/03/2024).    Cleatus Debby Specking, MD

## 2024-06-03 NOTE — Assessment & Plan Note (Signed)
 Symptoms have improved with weight gain. Endoscopy shows mild erythema; biopsies are negative for eosinophilic esophagitis and H. pylori, consistent with reflux. Continue Protonix  once daily.

## 2024-06-08 DIAGNOSIS — F418 Other specified anxiety disorders: Secondary | ICD-10-CM | POA: Diagnosis not present

## 2024-06-09 ENCOUNTER — Encounter: Payer: Self-pay | Admitting: Student in an Organized Health Care Education/Training Program

## 2024-06-17 DIAGNOSIS — N50811 Right testicular pain: Secondary | ICD-10-CM | POA: Diagnosis not present

## 2024-06-17 DIAGNOSIS — N50812 Left testicular pain: Secondary | ICD-10-CM | POA: Diagnosis not present

## 2024-06-30 DIAGNOSIS — N5082 Scrotal pain: Secondary | ICD-10-CM | POA: Diagnosis not present

## 2024-06-30 DIAGNOSIS — N4889 Other specified disorders of penis: Secondary | ICD-10-CM | POA: Diagnosis not present

## 2024-06-30 DIAGNOSIS — N433 Hydrocele, unspecified: Secondary | ICD-10-CM | POA: Diagnosis not present

## 2024-07-28 DIAGNOSIS — F418 Other specified anxiety disorders: Secondary | ICD-10-CM | POA: Diagnosis not present

## 2024-08-11 DIAGNOSIS — Z79899 Other long term (current) drug therapy: Secondary | ICD-10-CM | POA: Diagnosis not present

## 2024-08-11 DIAGNOSIS — F418 Other specified anxiety disorders: Secondary | ICD-10-CM | POA: Diagnosis not present

## 2024-08-12 ENCOUNTER — Encounter (INDEPENDENT_AMBULATORY_CARE_PROVIDER_SITE_OTHER): Payer: Self-pay | Admitting: Gastroenterology

## 2024-08-12 ENCOUNTER — Ambulatory Visit (INDEPENDENT_AMBULATORY_CARE_PROVIDER_SITE_OTHER): Admitting: Gastroenterology

## 2024-08-12 VITALS — BP 115/74 | HR 79 | Temp 98.1°F | Ht 72.0 in | Wt 177.0 lb

## 2024-08-12 DIAGNOSIS — R1319 Other dysphagia: Secondary | ICD-10-CM | POA: Diagnosis not present

## 2024-08-12 DIAGNOSIS — E538 Deficiency of other specified B group vitamins: Secondary | ICD-10-CM | POA: Diagnosis not present

## 2024-08-12 DIAGNOSIS — K921 Melena: Secondary | ICD-10-CM | POA: Diagnosis not present

## 2024-08-12 DIAGNOSIS — K219 Gastro-esophageal reflux disease without esophagitis: Secondary | ICD-10-CM

## 2024-08-12 NOTE — Patient Instructions (Addendum)
 It was very nice to meet you today, as dicussed with will plan for the following :  1) protonix  40mg  , 30 min before breakfast   2) Colonoscopy

## 2024-08-12 NOTE — Progress Notes (Signed)
 Joshua Hurst , M.D. Gastroenterology & Hepatology [facility]astroenter[facility] [address] [place], [place] [address] Primary Care Physician: Jerrell Cleatus Ned, MD [address] [place] [address]  Chief Complaint: Dysphagia, hematochezia History of Present Illness:  Joshua Hurst is a 28 y.o. male with developmental delay who is here for follow-up of nausea, vomiting, dysphagia and hematochezia  Patient  underwent upper endoscopy with dilation.  Biopsies negative for EOE but positive for reflux  Most of the history is obtained by father as patient is not speaking much.    Previously patient was started on pantoprazole  and his upper GI symptoms resolved at that time where last time the father reported patient was eating excessively  Today patient father reports about his eating habits where now he would only be cheese pizza and pancakes.  Unsure if he is taking Protonix  now but reports nausea and vomiting  CT previously demonstrated pneumomediastinum followed esophagogram ruled out esophageal perforation . This happened in setting of excessive retching and vomiting .  Patient father reports that patient is complaining seeing fresh blood intermittently upon wiping, reports bowel movements are Bristol stool scale type IV every day without much straining.    Most recent labs with normal liver enzymes, B12 deficiency:163.  Hemoglobin 16 platelet 219.  Normal total IgA and negative celiac Hemoglobin A1c 4.8 TSH 1.89  Percent saturation 16, ferritin 138  Last EGD: 2025  - No endoscopic esophageal abnormality to explain patient' s dysphagia. Esophagus dilated. Dilated. - Erythematous mucosa in the antrum. Biopsied. - Normal duodenal bulb and second portion of the duodenum. - Biopsies were taken with a cold forceps for evaluation of eosinophilic esophagitis.   A. STOMACH BIOPSY:  - Gastric antral mucosa with no specific histopathologic  changes  - Helicobacter pylori-like organisms are not identified on routine HE  stain   B. ESOPHAGEAL BIOPSY:  - Esophageal squamous mucosa with mild vascular congestion, and focal  squamous ballooning, suggestive of reflux esophagitis  - Negative for increased intraepithelial eosinophils  2021  - Esophagogastric landmarks identified. - Normal esophagus otherwise - empiric dilation performed and biopsies obtained as above - Normal stomach. Biopsied. - Suspected minor papilla in the duodenum. Biopsied to rule out adenomatous change. - Normal duodenum oherwise.  1. Surgical [P], duodenal nodule - GASTRIC HETEROTOPIA 2. 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 Surgical [P], gastric antrum and body - GASTRIC ANTRAL MUCOSA WITH MILD NONSPECIFIC REACTIVE GASTROPATHY - GASTRIC OXYNTIC MUCOSA WITH NO SPECIFIC HISTOPATHOLOGIC CHANGES - WARTHIN STARRY STAIN IS NEGATIVE FOR HELICOBACTER PYLORI 4. Surgical [P], esophagus - ESOPHAGEAL SQUAMOUS MUCOSA WITH VASCULAR CONGESTION, AND SQUAMOUS BALLOONING, SUGGESTIVE OF REFLUX ESOPHAGITIS - NEGATIVE FOR INCREASED INTRAEPITHELIAL EOSINOPHILS  Last Colonoscopy:none  FHx: neg for any gastrointestinal/liver disease, no malignancies Social: neg smoking, alcohol or illicit drug use Surgical: no abdominal surgeries  Past Medical History: Past Medical History:  Diagnosis Date   Acid reflux    Allergy    Spring allergies   Development delay     Past Surgical History: Past Surgical History:  Procedure Laterality Date   ESOPHAGEAL DILATION N/A 04/07/2024   Procedure: DILATION, ESOPHAGUS;  Surgeon: Cinderella Deatrice FALCON, MD;  Location: [facility];  Service: Endoscopy;  Laterality: N/A;   ESOPHAGOGASTRODUODENOSCOPY N/A 04/07/2024   Procedure: EGD (ESOPHAGOGASTRODUODENOSCOPY);  Surgeon: Cinderella Deatrice FALCON, MD;  Location: [facility];  Service: Endoscopy;  Laterality: N/A;   1230pm, asa 1-2,  unable to reach pt to move up    Family History: Family History  Problem Relation Age of Onset   Hypertension Father     Social History: Social History   Tobacco Use  Smoking Status Never  Smokeless Tobacco Never   Social History   Substance and Sexual Activity  Alcohol Use No   Social History   Substance and Sexual Activity  Drug Use No    Allergies: Allergies  Allergen Reactions   Omnicef [Cefdinir] Rash   Penicillins Rash    Medications: Current Outpatient Medications  Medication Sig Dispense Refill   cyanocobalamin  (VITAMIN B12) 1000 MCG tablet Take 1 tablet (1,000 mcg total) by mouth daily. 90 tablet 1   hydrocortisone  (ANUSOL -HC) 25 MG suppository Place 1 suppository (25 mg total) rectally 2 (two) times daily. 12 suppository 0   pantoprazole  sodium (PROTONIX ) 40 mg Take 40 mg by mouth daily. 30 each 2   sertraline (ZOLOFT) 25 MG tablet 1 tablet Orally Once a day; Duration: 30 days     QUEtiapine (SEROQUEL) 25 MG tablet 1 tablet at bedtime Orally Once a day; Duration: 30 days (Patient not taking: Reported on 08/12/2024)     No current facility-administered medications for this visit.    Review of Systems: GENERAL: negative for malaise, night sweats HEENT: No changes in hearing or vision, no nose bleeds or other nasal problems. NECK: Negative for lumps, goiter, pain and significant neck swelling RESPIRATORY: Negative for cough, wheezing CARDIOVASCULAR: Negative for chest pain, leg swelling, palpitations, orthopnea GI: SEE HPI MUSCULOSKELETAL: Negative for joint pain or swelling, back pain, and muscle pain. SKIN: Negative for lesions, rash HEMATOLOGY Negative for prolonged bleeding, bruising easily, and swollen nodes. ENDOCRINE: Negative for cold or heat intolerance, polyuria, polydipsia and goiter. NEURO: negative for tremor, gait imbalance, syncope and seizures. The remainder of the review of systems is noncontributory.   Physical  Exam: BP 115/74   Pulse 79   Temp 98.1 F (36.7 C)   Ht 6' (1.829 m)   Wt 177 lb (80.3 kg)   BMI 24.01 kg/m  GENERAL: The patient is AO x3, in no acute distress. HEENT: Head is normocephalic and atraumatic. EOMI are intact. Mouth is well hydrated and without lesions. NECK: Supple. No masses LUNGS: Clear to auscultation. No presence of rhonchi/wheezing/rales. Adequate chest expansion HEART: RRR, normal s1 and s2. ABDOMEN: Soft, nontender, no guarding, no peritoneal signs, and nondistended. BS +. No masses.   Imaging/Labs: as above     Latest Ref Rng & Units 03/04/2024   11:15 AM 02/27/2024    9:22 AM  CBC  WBC 4.0 - 10.5 K/uL 6.6  9.7   Hemoglobin 13.0 - 17.0 g/dL 83.9  85.2   Hematocrit 39.0 - 52.0 % 47.1  43.8   Platelets 150.0 - 400.0 K/uL 219.0  204    Lab Results  Component Value Date   IRON 52 03/04/2024   TIBC 323.4 03/04/2024   FERRITIN 138.5 03/04/2024    I personally reviewed and interpreted the available labs, imaging and endoscopic files. IMPRESSION: Normal water-soluble contrast esophagram. No extravasation was observed.    IMPRESSION: Soft tissue emphysema likely tracking from the chest, no airway swelling or perforation seen in the neck.   IMPRESSION: Extensive pneumomediastinum, usually barotrauma. There is no esophageal thickening and no extravasation of swallowed contrast.  Impression and Plan:   JAYDAN MEIDINGER is a 28 y.o. male with developmental delay who is here for follow-up of nausea, vomiting, dysphagia and hematochezia  #  Hematochezia  Patient has painless intermittent hematochezia upon wiping.  This could be from anorectal lesion such as hemorrhoids but given young age and subjective weight loss need to rule out inflammatory bowel disease with ileo-colonoscopy  I discussed with father risk-benefit indication limitation to colonoscopy to evaluate for the cause of hematochezia.   Ensure adequate fluid intake: Aim for 8 glasses of water  daily. Follow a high fiber diet: Include foods such as dates, prunes, pears, and kiwi. Use Metamucil twice a day. Schedule Ileo-Colonoscopy   I thoroughly discussed with the patient father  the procedure, including the risks involved.  patient fatherunderstands what the procedure involves including the benefits and any risks. patient fatherunderstands alternatives to the proposed procedure. Risks including (but not limited to) bleeding, tearing of the lining (perforation), rupture of adjacent organs, problems with heart and lung function, infection, and medication reactions. A small percentage of complications may require surgery, hospitalization, repeat endoscopic procedure, and/or transfusion.   patient father understood and agreed.    #Dysphagia  #Nausea/vomiting  This is likely due to reflux.  Patient underwent upper endoscopy with dilation.  Biopsies negative for EOE but positive for reflux  Since starting PPI patient's symptoms had resolved but it relapsed as patient is eating on cheese pizza and not taking PPU  If patient symptoms persist despite above then will refer the patient for 24 hours pH impedance with esophageal manometry off PPI- although would be challenging as unsure if patient will follow instructions  #Vitamin b12 deficiency  Labs with vitamin B12 deficiency   recommend sending workup for pernicious anemia with anti-intrinsic factor antibody antiparietal antibody with next blood work.  Started vitamin B12 supplementation  Recent EGD with biopsies negative for atrophic autoimmune gastritis.  All questions were answered.      Krishika Bugge Faizan Bora Broner, MD Gastroenterology and Hepatology [facility] Gastroenter[facility]   This chart has been completed using [facility] Dictation software, and while attempts have been made to ensure accuracy , certain words and phrases may not be transcribed as intended

## 2024-08-13 ENCOUNTER — Other Ambulatory Visit (INDEPENDENT_AMBULATORY_CARE_PROVIDER_SITE_OTHER): Payer: Self-pay | Admitting: Gastroenterology

## 2024-08-13 DIAGNOSIS — R1319 Other dysphagia: Secondary | ICD-10-CM

## 2024-08-13 DIAGNOSIS — K219 Gastro-esophageal reflux disease without esophagitis: Secondary | ICD-10-CM

## 2024-08-15 ENCOUNTER — Telehealth (INDEPENDENT_AMBULATORY_CARE_PROVIDER_SITE_OTHER): Payer: Self-pay | Admitting: *Deleted

## 2024-08-15 ENCOUNTER — Other Ambulatory Visit (INDEPENDENT_AMBULATORY_CARE_PROVIDER_SITE_OTHER): Payer: Self-pay | Admitting: *Deleted

## 2024-08-15 ENCOUNTER — Encounter (INDEPENDENT_AMBULATORY_CARE_PROVIDER_SITE_OTHER): Payer: Self-pay | Admitting: *Deleted

## 2024-08-15 MED ORDER — CLENPIQ 10-3.5-12 MG-GM -GM/175ML PO SOLN: 1.0000 | ORAL | 0 refills | Status: DC

## 2024-08-15 NOTE — Telephone Encounter (Signed)
 [employer] PA: Thank you for your online prior authorization/notification submission.  The prior authorization/notification reference number is: [id number].

## 2024-08-17 NOTE — Telephone Encounter (Signed)
 [employer] PA: Covered/Approved, authorization # [id number] Start date 09/16/2024 End date 12/15/2024

## 2024-08-18 ENCOUNTER — Encounter (HOSPITAL_COMMUNITY): Payer: Self-pay

## 2024-08-18 ENCOUNTER — Ambulatory Visit (HOSPITAL_COMMUNITY): Admitting: Registered Nurse

## 2024-08-18 ENCOUNTER — Encounter (HOSPITAL_COMMUNITY): Payer: Self-pay | Admitting: Registered Nurse

## 2024-08-18 DIAGNOSIS — Z79899 Other long term (current) drug therapy: Secondary | ICD-10-CM

## 2024-08-18 NOTE — Patient Instructions (Signed)

## 2024-08-18 NOTE — Progress Notes (Signed)
 Psychiatric Initial Adult Assessment   Patient Identification: Joshua Hurst MRN:  [id number]  Virtual Visit via Video Note  I connected with Joshua Hurst on 08/18/24 at  8:30 AM EST by a video enabled telemedicine application and verified that I am speaking with the correct person using two identifiers.  Location: Patient: Home Provider: Home office   I discussed the limitations of evaluation and management by telemedicine and the availability of in person appointments. The patient expressed understanding and agreed to proceed.  I discussed the assessment and treatment plan with the patient. The patient was provided an opportunity to ask questions and all were answered. The patient agreed with the plan and demonstrated an understanding of the instructions.   The patient was advised to call back or seek an in-person evaluation if the symptoms worsen or if the condition fails to improve as anticipated.  I provided 30 minutes of non-face-to-face time during this encounter.   Luisa Ruder, NP  Date of Evaluation:  08/18/2024 Referral Source: Dr. jerrell Chief Complaint:   Chief Complaint  Patient presents with   Establish Care    Medication management   Visit Diagnosis: No diagnosis found.  Unable to complete visit related to camera not working for virtual visit.  Father of the patient was doing most of the talking.  Patient reporting he did not understand questions that were being asked.  Informed patient and father that it would be better to do a in person visit related to the camera not working and patient not understanding the conversation that was occurring.  Father and patient both agree.  Informed there would be no charge for this visit since the assessment was unable to be completed.  Eric Nees B. Torence Palmeri, NP    Joshua [place]illing[place], NP 11/13/20258:51 AM

## 2024-08-22 ENCOUNTER — Ambulatory Visit (HOSPITAL_COMMUNITY): Admitting: Registered Nurse

## 2024-08-22 ENCOUNTER — Other Ambulatory Visit: Payer: Self-pay

## 2024-08-22 ENCOUNTER — Encounter (HOSPITAL_COMMUNITY): Payer: Self-pay | Admitting: Registered Nurse

## 2024-08-22 VITALS — BP 109/69 | HR 63 | Ht 72.0 in | Wt 176.2 lb

## 2024-08-22 DIAGNOSIS — F331 Major depressive disorder, recurrent, moderate: Secondary | ICD-10-CM | POA: Diagnosis not present

## 2024-08-22 DIAGNOSIS — F411 Generalized anxiety disorder: Secondary | ICD-10-CM

## 2024-08-22 DIAGNOSIS — F29 Unspecified psychosis not due to a substance or known physiological condition: Secondary | ICD-10-CM | POA: Diagnosis not present

## 2024-08-22 MED ORDER — OLANZAPINE 5 MG PO TABS: 5.0000 mg | ORAL_TABLET | Freq: Every day | ORAL | 2 refills | Status: DC

## 2024-08-22 MED ORDER — SERTRALINE HCL 25 MG PO TABS: 25.0000 mg | ORAL_TABLET | Freq: Every day | ORAL | 2 refills | Status: DC

## 2024-08-22 NOTE — Progress Notes (Signed)
 Psychiatric Initial Adult Assessment   Patient Identification: Joshua Hurst MRN:  [id number] Date of Evaluation:  08/22/2024 Referral Source: Jerrell Cleatus Ned, MD [facility] Chief Complaint:   Chief Complaint  Patient presents with   Establish Care    Medication management   Visit Diagnosis:    ICD-10-CM   1. Moderate episode of recurrent major depressive disorder (HCC)  F33.1 sertraline (ZOLOFT) 25 MG tablet    2. GAD (generalized anxiety disorder)  F41.1 sertraline (ZOLOFT) 25 MG tablet    3. Psychosis, unspecified psychosis type (HCC)  F29 OLANZapine (ZYPREXA) 5 MG tablet      History of Present Illness:  Joshua Hurst 28 y.o. male presents today accompanied by his father to establish care for medication management.  He was seen face-to-face by this provide and chart reviewed on 08/22/24.  He gives permission for his father sitting and doing assessment for collateral information.  His father is the primary historian related to patient only making head gestures of yes and no or shaking his hand as if so/so gesture, and stating I don't know to most questions.  His psychiatric history is significant for major depression with psychosis, general anxiety, and auditory hallucinations.  His father states he has never been diagnosed with an intellectual disability.  Reports that he does not talk much at home and the only time that he is talkative is when he is on the video game.  Father reports he graduated high school but his Ranny did all of his work.  When Joshua Hurst asked if he could read he makes a hand gesture so/so.  His father reports they were [facility].  His father reports there is no agitation or aggressive behaviors.  He reports that he also has a history of being in the same way but has become more open since starting psychotropic medications himself.  Joshua Hurst denies suicidal/self-harm/homicidal ideation, paranoia, and abnormal movement.   When asked if he was still having auditory hallucinations he stated he did not know.  His father reports within the last 2 weeks he has made a statement of hearing voices.  Joshua Hurst's mental health is currently managed with Zoloft 25 mg daily, also prescription for Seroquel 25 mg daily at bedtime but it has not been filled related to insurance issues.  Reports tolerating Zoloft without any adverse reaction.  Medication adjustments discussed with father Joshua Hurst agrees to trial of Zyprexa for auditory hallucinations and depression. Screenings completed during today's visit PHQ-9, C-SSRS, GAD-7, AIMS, AUDIT, Nutrition, and Pain, see scores below.  Recommendations: Continue Zoloft 25 mg daily, start Zyprexa 5 mg daily, discontinue Seroquel He and father were educated on the side effect and efficacy profile of Zyprexa and educational material was added to AVS. Informed that therapeutic effects may take several weeks to become noticeable.  They both voiced understanding and agreement with today's plan and recommendations.  Associated Signs/Symptoms: Depression Symptoms:  depressed mood, anxiety, loss of energy/fatigue, (Hypo) Manic Symptoms:  Irritable Mood, Anxiety Symptoms:  Excessive Worry, Social Anxiety, Psychotic Symptoms:  Hallucinations: Visual PTSD Symptoms: NA  Past Psychiatric History:  Diagnosis: Major depression, anxiety, hallucinations Suicide attempt: Denies Non-suicidal self-injurious behavior: Denies Psychiatric hospitalization: Denies Past trauma: Denies Substance abuse: Denies Past psychotropic medication trials: On medications prescribed for Zoloft, Seroquel (never started)  Previous Psychotropic Medications: Yes   Substance Abuse History in the last 12 months:  No.  Consequences of Substance Abuse: NA  Past Medical History:  Past Medical History:  Diagnosis Date   Acid reflux    Allergy    Spring allergies   Anxiety    Depression    Development delay     Past  Surgical History:  Procedure Laterality Date   ESOPHAGEAL DILATION N/A 04/07/2024   Procedure: DILATION, ESOPHAGUS;  Surgeon: Cinderella Deatrice FALCON, MD;  [facility];  Service: Endoscopy;  Laterality: N/A;   ESOPHAGOGASTRODUODENOSCOPY N/A 04/07/2024   Procedure: EGD (ESOPHAGOGASTRODUODENOSCOPY);  Surgeon: Cinderella Deatrice FALCON, MD;  Location: [facility];  Service: Endoscopy;  Laterality: N/A;  1230pm, asa 1-2, unable to reach pt to move up    Family Psychiatric History: Unaware but father states there may be mental illnesses on maternal side.  Sibling family history  Family History:  Family History  Problem Relation Age of Onset   Depression Father    Anxiety disorder Father    Hypertension Father     Social History:   Social History   Socioeconomic History   Marital status: Single    Spouse name: Not on file   Number of children: 0   Years of education: 12 grade   Highest education level: 12th grade  Occupational History   Not on file  Tobacco Use   Smoking status: Never   Smokeless tobacco: Never  Vaping Use   Vaping status: Never Used  Substance and Sexual Activity   Alcohol use: No   Drug use: No   Sexual activity: Never  Other Topics Concern   Not on file  Social History Narrative   Not on file   Social Drivers of Health   Financial Resource Strain: High Risk (03/31/2024)   Overall Financial Resource Strain (CARDIA)    Difficulty of Paying Living Expenses: Very hard  Food Insecurity: No Food Insecurity (03/31/2024)   Hunger Vital Sign    Worried About Running Out of Food in the Last Year: Never true    Ran Out of Food in the Last Year: Never true  Transportation Needs: No Transportation Needs (03/31/2024)   PRAPARE - Administrator, Civil Service (Medical): No    Lack of Transportation (Non-Medical): No  Physical Activity: Inactive (03/31/2024)   Exercise Vital Sign    Days of Exercise per Week: 0 days    Minutes of Exercise per Session:  Not on file  Stress: Stress Concern Present (03/31/2024)   [employer] of Occupational Health - Occupational Stress Questionnaire    Feeling of Stress: Rather much  Social Connections: Moderately Isolated (03/31/2024)   Social Connection and Isolation Panel    Frequency of Communication with Friends and Family: More than three times a week    Frequency of Social Gatherings with Friends and Family: More than three times a week    Attends Religious Services: 1 to 4 times per year    Active Member of [employer] or Organizations: No    Attends Banker Meetings: Not on file    Marital Status: Never married   Allergies:   Allergies  Allergen Reactions   Omnicef [Cefdinir] Rash   Penicillins Rash    Metabolic Disorder Labs: Lab Results  Component Value Date   HGBA1C 4.8 03/04/2024   No results found for: PROLACTIN No results found for: CHOL, TRIG, HDL, CHOLHDL, VLDL, LDLCALC Lab Results  Component Value Date   TSH 1.89 03/04/2024    Current Medications: Current Outpatient Medications  Medication Sig Dispense Refill   cyanocobalamin  (VITAMIN B12) 1000 MCG tablet Take 1 tablet (1,000 mcg  total) by mouth daily. 90 tablet 1   OLANZapine (ZYPREXA) 5 MG tablet Take 1 tablet (5 mg total) by mouth at bedtime. 30 tablet 2   pantoprazole  (PROTONIX ) 40 MG tablet TAKE 1 TABLET (40 MG) BY MOUTH DAILY. 30 tablet 2   Sod Picosulfate-Mag Ox-Cit Acd (CLENPIQ) 10-3.5-12 MG-GM -GM/175ML SOLN Take 1 kit by mouth as directed. 350 mL 0   hydrocortisone  (ANUSOL -HC) 25 MG suppository Place 1 suppository (25 mg total) rectally 2 (two) times daily. (Patient not taking: Reported on 08/22/2024) 12 suppository 0   sertraline (ZOLOFT) 25 MG tablet Take 1 tablet (25 mg total) by mouth daily. 30 tablet 2   No current facility-administered medications for this visit.    Musculoskeletal: Strength & Muscle Tone: within normal limits Gait & Station: normal Patient leans:  N/A  Psychiatric Specialty Exam: Review of Systems  Constitutional:        No other complaints may  Psychiatric/Behavioral:  Positive for agitation, dysphoric mood and hallucinations. Negative for self-injury, sleep disturbance and suicidal ideas. The patient is nervous/anxious.   All other systems reviewed and are negative.   Blood pressure 109/69, pulse 63, height 6' (1.829 m), weight 176 lb 3.2 oz (79.9 kg), SpO2 100%.Body mass index is 23.9 kg/m.  General Appearance: Casual  Eye Contact:  None  Speech:  Clear and Coherent, Normal Rate, and did not respond to most questions mainly shaking he back and forth as I do not know or making hand gestures of so/so  Volume:  Decreased  Mood:  Dysphoric  Affect:  Congruent  Thought Process:  Coherent and Linear  Orientation:  Full (Time, Place, and Person)  Thought Content:  WDL and Hallucinations: Auditory  Suicidal Thoughts:  No  Homicidal Thoughts:  No  Memory:  Immediate;   Poor Recent;   Poor Remote;   Poor  Judgement:  Fair  Insight:  Lacking  Psychomotor Activity:  Normal  Concentration:  Concentration: Fair and Attention Span: Fair  Recall:  Poor  Fund of Knowledge:Poor  Language: Good  Akathisia:  No  Handed:  Right  AIMS (if indicated):  done  Assets:  Housing Leisure Time Physical Health Resilience Social Support  ADL's:  Intact  Cognition: WNL  Sleep:  Good   Screenings: AIMS    Flowsheet Row Office Visit from 08/22/2024 in [facility] [place]  AIMS Total Score 0   GAD-7    Flowsheet Row Office Visit from 08/22/2024 in [facility] [place] Office Visit from [facility]04/2024 [facility]  Total GAD-7 Score 4 21   PHQ2-9    Flowsheet Row Office Visit from 08/22/2024 in [facility] [place] Office Visit from 03/04/2024 in [facility] Visit from 04/19/2020 in [facility] Office Visit from 11/19/2018 in [facility] Office Visit from 10/13/2018 in [facility]  PHQ-2 Total Score 3 3 0 0 0  PHQ-9 Total Score 8 15 -- -- --   Flowsheet Row Office Visit from 08/22/2024 in [facility] [place] Admission (Discharged) from 04/07/2024 in [facility] from 02/27/2024 in [facility] Department at [facility]  C-SSRS RISK CATEGORY No Risk No Risk No Risk    Assessment and Plan: Assessment: Summary of today's assessment: Joshua Hurst appears to be doing fairly well.  He is mainly nonverbal doing assessment.  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  He mainly makes hand gestures, shakes his head, or answers I do not know to questions.  His father is primary historian.  Reported auditory hallucinations, depression, and anxiety.  Medication adjustments discussed and adjustments made.  Joshua Hurst does denies suicidal/self-harm/homicidal ideations, paranoia, and abnormal movement. During visit he was dressed appropriate for age and weather.  He was seated comfortably in chair with no noted distress.  He was alert/oriented x 4, calm/cooperative and mood congruent with affect.  There was no eye contact.  He looks straight for her down and his hands throughout assessment.  There was no indication that he was responding to internal/external stimuli or experiencing delusional thought content other than his father's statement of auditory hallucinations.  1. Moderate episode of recurrent major depressive disorder (HCC) (Primary) - sertraline (ZOLOFT) 25 MG tablet; Take 1 tablet (25 mg total) by mouth daily.  Dispense: 30 tablet; Refill: 2  2. GAD (generalized anxiety disorder) - sertraline (ZOLOFT) 25 MG  tablet; Take 1 tablet (25 mg total) by mouth daily.  Dispense: 30 tablet; Refill: 2  3. Psychosis, unspecified psychosis type (HCC) - OLANZapine (ZYPREXA) 5 MG tablet; Take 1 tablet (5 mg total) by mouth at bedtime.  Dispense: 30 tablet; Refill: 2       Plan: Medication management: Meds ordered this encounter  Medications   OLANZapine (ZYPREXA) 5 MG tablet    Sig: Take 1 tablet (5 mg total) by mouth at bedtime.    Dispense:  30 tablet    Refill:  2    Supervising Provider:   ARFEEN, SYED T [[id number]]   sertraline (ZOLOFT) 25 MG tablet    Sig: Take 1 tablet (25 mg total) by mouth daily.    Dispense:  30 tablet    Refill:  2    Supervising Provider:   ARFEEN, SYED T [[id number]]   Medications Discontinued During This Encounter  Medication Reason   QUEtiapine (SEROQUEL) 25 MG tablet Not covered by the pt's insurance   sertraline (ZOLOFT) 25 MG tablet Reorder    Labs:  Most recent labs reviewed.  Lab orders not indicated at this time.     Other:  Counseling/Therapy:  Declined.   Referral for psychological testing and resources given Joshua Hurst was instructed to call 911, [phone], mobile crisis, or present to the nearest emergency room should he experiences any suicidal/homicidal ideation, auditory/visual/hallucinations, or detrimental worsening of his mental health condition.   Joshua Hurst and his father participated in the development of this treatment plan and verbalized understanding/agreement with plan as listed.   Follow Up: Return in 2 months for medication management Call in the interim for any side-effects, decompensation, questions, or problems  Collaboration of Care: Medication Management AEB medication assessment, adjustment, refills, started Zyprexa and Other referral for psychological testing and resources given  Patient/Guardian was advised Release of Information must be obtained prior to any record release in order to collaborate their care with an outside provider.  Patient/Guardian was advised if they have not already done so to contact the registration department to sign all necessary forms in order for us  to release information regarding their care.   Consent: Patient/Guardian gives verbal consent for treatment and assignment of benefits for services provided during this visit. Patient/Guardian expressed understanding and agreed to proceed.   Joshua Ruder, NP 11/17/202510:49  AM

## 2024-08-22 NOTE — Patient Instructions (Addendum)
 Call 911, [phone], mobile crisis, or present to the nearest emergency room should you experience any suicidal/homicidal ideation, auditory/visual/hallucinations, or detrimental worsening of your mental health.  Mobile Crisis Response Teams Listed by counties in vicinity of [facility] providers [facility] [phone] [facility] [phone] [facility] [phone] [facility] [phone] [place]                * [facility] [phone]                * [facility] [phone]  [facility] [phone] [facility] * [facility]  [phone] * [facility] [phone]       The [facility] offers in-person therapy and testing services. We are open Monday to Thursday from 9 AM to 7 PM, and on Friday from 9 AM - 5 PM.  If you would like to request individual therapy, group therapy, or testing services, please call our main line at [phone] or click "Get Started Now" to submit an Interest Form! [facility] [address] [place], [place] [address] Phone [phone]; Fax [phone]  Adult Testing We provide comprehensive psychological evaluations for a variety of cognitive, educational, emotional, and behavioral difficulties for adults. Some types of evaluations we provide include:  Full Psychological Evaluations Each evaluation is tailored to address the referral question and current difficulties clients have when seeking services. Clients who are experiencing cognitive, emotional, behavioral, and/or interpersonal difficulties. This type of evaluation may benefit clients by gaining a better understanding of his/her/their mental health condition and it can also determine whether he/she/they  require therapeutic services and/or academic accommodations. Note we do not offer full psychological evaluations for Autism Spectrum Disorder in adults. Examples of referral questions that are most appropriate for Full Psychological Evaluations are:  Depression Anxiety Post Traumatic Stress Disorder (PTSD) Personality Disorders Obsessive-Compulsive Disorder (OCD) Attention Deficit/Hyperactivity Disorder (ADHD) Learning Disorders (LD/SLDs) for individuals with the following referral questions: Difficulties with Written Language (e.g., dysgraphia) Difficulties with Mathematical Skills and Calculation (e.g., dyscalculia) Difficulties with Reading Skills (e.g., dyslexia)

## 2024-09-07 DIAGNOSIS — N4400 Torsion of testis, unspecified: Secondary | ICD-10-CM | POA: Diagnosis not present

## 2024-09-07 DIAGNOSIS — N50819 Testicular pain, unspecified: Secondary | ICD-10-CM | POA: Diagnosis not present

## 2024-09-07 DIAGNOSIS — N50811 Right testicular pain: Secondary | ICD-10-CM | POA: Diagnosis not present

## 2024-09-09 ENCOUNTER — Encounter: Payer: Self-pay | Admitting: Student in an Organized Health Care Education/Training Program

## 2024-09-09 ENCOUNTER — Ambulatory Visit (INDEPENDENT_AMBULATORY_CARE_PROVIDER_SITE_OTHER): Admitting: Student in an Organized Health Care Education/Training Program

## 2024-09-09 VITALS — BP 117/74 | HR 59 | Wt 193.0 lb

## 2024-09-09 DIAGNOSIS — F323 Major depressive disorder, single episode, severe with psychotic features: Secondary | ICD-10-CM

## 2024-09-09 DIAGNOSIS — R634 Abnormal weight loss: Secondary | ICD-10-CM

## 2024-09-09 NOTE — Patient Instructions (Signed)
  VISIT SUMMARY: Today, you came in for a follow-up on your ongoing gastrointestinal symptoms and psychiatric medication management. We discussed your current symptoms, medications, and overall health.  YOUR PLAN: -MOOD DISORDER: A mood disorder affects your emotional state. You are currently taking Zyprexa  5 mg and Zoloft  25 mg daily, and you have no significant complaints. We will continue with these medications and coordinate with your psychiatrist to further improve your mood and functionality.  -GASTROESOPHAGEAL REFLUX DISEASE: Gastroesophageal reflux disease (GERD) is a condition where stomach acid frequently flows back into the tube connecting your mouth and stomach. You continue to experience vomiting and a sensation of choking on food, but your recent endoscopy was normal, and there has been slight improvement. Continue taking Protonix  once daily.  -HYDROCELE: A hydrocele is a fluid-filled sac around a testicle, often causing swelling. You have a small hydrocele that does not require any intervention at this time. We will continue to monitor it for any changes.  -GENERAL HEALTH MAINTENANCE: Your weight is stable, and there are no new skin concerns. Your recent blood work was normal. We will schedule a follow-up in six months for routine evaluation and blood work.  INSTRUCTIONS: Please continue taking your medications as prescribed: Zyprexa  5 mg and Zoloft  25 mg daily for your mood disorder, and Protonix  once daily for GERD. Monitor your hydrocele for any changes. Maintain your current eating habits and consider incorporating some physical activity into your daily routine. Schedule a follow-up appointment in six months for routine evaluation and blood work.

## 2024-09-09 NOTE — Progress Notes (Signed)
   Established Patient Office Visit  Patient ID: Joshua Hurst, male    DOB: May 12, 1996  Age: 28 y.o. MRN: [id number] PCP: Jerrell Cleatus Ned, MD  Chief Complaint  Patient presents with   Medical Management of Chronic Issues    3 month follow up     Subjective:     HPI  Discussed the use of AI scribe software for clinical note transcription with the patient, who gave verbal consent to proceed.  History of Present Illness Joshua Hurst is a 28 year old male who presents for follow-up of ongoing gastrointestinal symptoms and psychiatric medication management.  He continues to experience vomiting and a sensation of choking on food, despite previous evaluations including a normal endoscopy. There is a slight improvement in symptoms, but discomfort persists.  He has been under psychiatric care and was started on Zoloft  25 mg and Zyprexa  5 mg daily about a month ago. He previously tried Seroquel. No significant complaints related to these medications.  He mentions a sensation of itchiness in his ears, which he finds bothersome.  His daily activities remain unchanged, primarily involving video games and TV, with no exercise or work. He reports a slight improvement in eating habits, but his weight remains stable.     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   He is now working with psychiatry on a regular basis which is great to see.  He is tolerating Zoloft  25 mg daily well and last month he started Zyprexa  5 mg daily.  I wonder if the Zyprexa  is helping with his issue of unintentional weight loss.  Will monitor him for metabolic syndrome findings.  Follow-up with me in 6 months and we will check blood work then.  He will have routine follow-up with psychiatry in the meantime.  Good family support.  Hopefully will become more functional in the future.      Unintentional weight loss (Chronic)   This issue seems to be resolving.  Likely related to psychiatric conditions.  We evaluated for structural issues, he had upper endoscopy which was reassuring.  He had a number of nutritional deficiencies including B12 and iron, which we have supplemented.  Now on Zyprexa  which I think will help with his nutrition intake.  Weight is stable over the last 3 months which is great to see.  Will follow-up with him now in 6 months for weight recheck, and will check vitamins and metabolic markings at that visit.       Return in about 6 months (around 03/10/2025).    Cleatus Ned Jerrell, MD [facility]

## 2024-09-09 NOTE — Assessment & Plan Note (Signed)
 This issue seems to be resolving.  Likely related to psychiatric conditions.  We evaluated for structural issues, he had upper endoscopy which was reassuring.  He had a number of nutritional deficiencies including B12 and iron, which we have supplemented.  Now on Zyprexa  which I think will help with his nutrition intake.  Weight is stable over the last 3 months which is great to see.  Will follow-up with him now in 6 months for weight recheck, and will check vitamins and metabolic markings at that visit.

## 2024-09-09 NOTE — Assessment & Plan Note (Signed)
 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  He is now working with psychiatry on a regular basis which is great to see.  He is tolerating Zoloft  25 mg daily well and last month he started Zyprexa  5 mg daily.  I wonder if the Zyprexa  is helping with his issue of unintentional weight loss.  Will monitor him for metabolic syndrome findings.  Follow-up with me in 6 months and we will check blood work then.  He will have routine follow-up with psychiatry in the meantime.  Good family support.  Hopefully will become more functional in the future.

## 2024-09-16 ENCOUNTER — Ambulatory Visit (HOSPITAL_COMMUNITY): Admitting: Anesthesiology

## 2024-09-16 ENCOUNTER — Ambulatory Visit (HOSPITAL_COMMUNITY)
Admission: RE | Admit: 2024-09-16 | Discharge: 2024-09-16 | Disposition: A | Attending: Gastroenterology | Admitting: Gastroenterology

## 2024-09-16 ENCOUNTER — Other Ambulatory Visit: Payer: Self-pay

## 2024-09-16 ENCOUNTER — Encounter (HOSPITAL_COMMUNITY): Admission: RE | Disposition: A | Payer: Self-pay | Attending: Gastroenterology

## 2024-09-16 ENCOUNTER — Encounter (HOSPITAL_COMMUNITY): Payer: Self-pay | Admitting: Gastroenterology

## 2024-09-16 DIAGNOSIS — K640 First degree hemorrhoids: Secondary | ICD-10-CM

## 2024-09-16 DIAGNOSIS — K649 Unspecified hemorrhoids: Secondary | ICD-10-CM | POA: Diagnosis not present

## 2024-09-16 DIAGNOSIS — I10 Essential (primary) hypertension: Secondary | ICD-10-CM | POA: Diagnosis not present

## 2024-09-16 DIAGNOSIS — K921 Melena: Secondary | ICD-10-CM | POA: Diagnosis present

## 2024-09-16 DIAGNOSIS — K6289 Other specified diseases of anus and rectum: Secondary | ICD-10-CM | POA: Diagnosis not present

## 2024-09-16 DIAGNOSIS — Z79899 Other long term (current) drug therapy: Secondary | ICD-10-CM | POA: Diagnosis not present

## 2024-09-16 DIAGNOSIS — F419 Anxiety disorder, unspecified: Secondary | ICD-10-CM | POA: Diagnosis not present

## 2024-09-16 DIAGNOSIS — K219 Gastro-esophageal reflux disease without esophagitis: Secondary | ICD-10-CM | POA: Diagnosis not present

## 2024-09-16 DIAGNOSIS — F32A Depression, unspecified: Secondary | ICD-10-CM | POA: Diagnosis not present

## 2024-09-16 LAB — HM COLONOSCOPY

## 2024-09-16 SURGERY — COLONOSCOPY
Anesthesia: Monitor Anesthesia Care

## 2024-09-16 MED ORDER — PROPOFOL 500 MG/50ML IV EMUL
INTRAVENOUS | Status: DC | PRN
  Administered 2024-09-16: 50 mg via INTRAVENOUS
  Administered 2024-09-16: 100 mg via INTRAVENOUS
  Administered 2024-09-16: 200 mg via INTRAVENOUS

## 2024-09-16 MED ORDER — LACTATED RINGERS IV SOLN: INTRAVENOUS | Status: DC

## 2024-09-16 NOTE — Discharge Instructions (Signed)

## 2024-09-16 NOTE — Transfer of Care (Signed)
 Immediate Anesthesia Transfer of Care Note  Patient: Joshua Hurst  Procedure(s) Performed: COLONOSCOPY  Patient Location: Endoscopy Unit  Anesthesia Type:MAC  Level of Consciousness: drowsy and patient cooperative  Airway & Oxygen Therapy: Patient Spontanous Breathing  Post-op Assessment: Report given to RN and Post -op Vital signs reviewed and stable  Post vital signs: Reviewed and stable  Last Vitals:  Vitals Value Taken Time  BP 89/40 09/16/24 11:21  Temp 36.6 C 09/16/24 11:19  Pulse 61 09/16/24 11:21  Resp 15 09/16/24 11:21  SpO2 99 % 09/16/24 11:21  B/p 89/47 1122  Last Pain:  Vitals:   09/16/24 1119  TempSrc: Oral  PainSc:       Patients Stated Pain Goal: 3 (09/16/24 1033)  Complications: No notable events documented.

## 2024-09-16 NOTE — H&P (Signed)
 Primary Care Physician:  Jerrell Cleatus Ned, MD Primary Gastroenterologist:  Dr. Cinderella  Pre-Procedure History & Physical: HPI: 28 y.o. male with developmental delay who is here for follow-up of hematochezia   Patient father reports that patient is complaining seeing fresh blood intermittently upon wiping, reports bowel movements are Bristol stool scale type IV every day without much straining.     Most recent labs with normal liver enzymes, B12 deficiency:163.  Hemoglobin 16 platelet 219.  Normal total IgA and negative celiac Hemoglobin A1c 4.8 TSH 1.89   Percent saturation 16, ferritin 138   Last EGD: 2025   - No endoscopic esophageal abnormality to explain patient' s dysphagia. Esophagus dilated. Dilated. - Erythematous mucosa in the antrum. Biopsied. - Normal duodenal bulb and second portion of the duodenum. - Biopsies were taken with a cold forceps for evaluation of eosinophilic esophagitis.     A. STOMACH BIOPSY:  - Gastric antral mucosa with no specific histopathologic changes  - Helicobacter pylori-like organisms are not identified on routine HE  stain   B. ESOPHAGEAL BIOPSY:  - Esophageal squamous mucosa with mild vascular congestion, and focal  squamous ballooning, suggestive of reflux esophagitis  - Negative for increased intraepithelial eosinophils  2021  Past Medical History:  Diagnosis Date   Acid reflux    Allergy    Spring allergies   Anxiety    Depression    Development delay     Past Surgical History:  Procedure Laterality Date   ESOPHAGEAL DILATION N/A 04/07/2024   Procedure: DILATION, ESOPHAGUS;  Surgeon: Cinderella Deatrice FALCON, MD;  Location: [facility];  Service: Endoscopy;  Laterality: N/A;   ESOPHAGOGASTRODUODENOSCOPY N/A 04/07/2024   Procedure: EGD (ESOPHAGOGASTRODUODENOSCOPY);  Surgeon: Cinderella Deatrice FALCON, MD;  Location: [facility];  Service: Endoscopy;  Laterality: N/A;  1230pm, asa 1-2, unable to reach pt to move up    Prior to  Admission medications  Medication Sig Start Date End Date Taking? Authorizing Provider  cyanocobalamin  (VITAMIN B12) 1000 MCG tablet Take 1 tablet (1,000 mcg total) by mouth daily. 06/03/24  Yes Jerrell Cleatus Ned, MD  OLANZapine  (ZYPREXA ) 5 MG tablet Take 1 tablet (5 mg total) by mouth at bedtime. 08/22/24 08/22/25 Yes Rankin, Shuvon B, NP  pantoprazole  (PROTONIX ) 40 MG tablet TAKE 1 TABLET (40 MG) BY MOUTH DAILY. 08/15/24  Yes Michael Walrath, Deatrice FALCON, MD  sertraline  (ZOLOFT ) 25 MG tablet Take 1 tablet (25 mg total) by mouth daily. 08/22/24  Yes Rankin, Shuvon B, NP    Allergies as of 08/15/2024 - Review Complete 08/12/2024  Allergen Reaction Noted   Omnicef [cefdinir] Rash 12/25/2012   Penicillins Rash 12/25/2012    Family History  Problem Relation Age of Onset   Depression Father    Anxiety disorder Father    Hypertension Father     Social History   Socioeconomic History   Marital status: Single    Spouse name: Not on file   Number of children: 0   Years of education: 12 grade   Highest education level: 12th grade  Occupational History   Not on file  Tobacco Use   Smoking status: Never   Smokeless tobacco: Never  Vaping Use   Vaping status: Never Used  Substance and Sexual Activity   Alcohol use: No   Drug use: No   Sexual activity: Never  Other Topics Concern   Not on file  Social History Narrative   Not on file   Social Drivers of Health   Tobacco Use: Low  Risk (09/16/2024)   Patient History    Smoking Tobacco Use: Never    Smokeless Tobacco Use: Never    Passive Exposure: Not on file  Financial Resource Strain: High Risk (03/31/2024)   Overall Financial Resource Strain (CARDIA)    Difficulty of Paying Living Expenses: Very hard  Food Insecurity: No Food Insecurity (03/31/2024)   Epic    Worried About Programme Researcher, Broadcasting/film/video in the Last Year: Never true    Ran Out of Food in the Last Year: Never true  Transportation Needs: No Transportation Needs (03/31/2024)    Epic    Lack of Transportation (Medical): No    Lack of Transportation (Non-Medical): No  Physical Activity: Inactive (03/31/2024)   Exercise Vital Sign    Days of Exercise per Week: 0 days    Minutes of Exercise per Session: Not on file  Stress: Stress Concern Present (03/31/2024)   [employer] of Occupational Health - Occupational Stress Questionnaire    Feeling of Stress: Rather much  Social Connections: Moderately Isolated (03/31/2024)   Social Connection and Isolation Panel    Frequency of Communication with Friends and Family: More than three times a week    Frequency of Social Gatherings with Friends and Family: More than three times a week    Attends Religious Services: 1 to 4 times per year    Active Member of [employer] or Organizations: No    Attends Engineer, Structural: Not on file    Marital Status: Never married  Intimate Partner Violence: Not At Risk (06/30/2024)   Received from [facility]   HITS    Over the last 12 months how often did your partner physically hurt you?: Never    Over the last 12 months how often did your partner insult you or talk down to you?: Never    Over the last 12 months how often did your partner threaten you with physical harm?: Never    Over the last 12 months how often did your partner scream or curse at you?: Never  Depression (PHQ2-9): Medium Risk (08/22/2024)   Depression (PHQ2-9)    PHQ-2 Score: 8  Alcohol Screen: Low Risk (08/22/2024)   Alcohol Screen    Last Alcohol Screening Score (AUDIT): 0  Housing: Low Risk (03/31/2024)   Epic    Unable to Pay for Housing in the Last Year: No    Number of Times Moved in the Last Year: 0    Homeless in the Last Year: No  Utilities: Not on file  Health Literacy: Not on file    Review of Systems: See HPI, otherwise negative ROS  Physical Exam: Vital signs in last 24 hours: Temp:  [98.1 F (36.7 C)] 98.1 F (36.7 C) (12/12 1033) Pulse Rate:  [80] 80 (12/12 1033) Resp:  [18] 18  (12/12 1033) SpO2:  [100 %] 100 % (12/12 1033) Weight:  [87.5 kg] 87.5 kg (12/12 1033)   General:   Alert,  Well-developed, well-nourished, pleasant and cooperative in NAD Head:  Normocephalic and atraumatic. Eyes:  Sclera clear, no icterus.   Conjunctiva pink. Ears:  Normal auditory acuity. Nose:  No deformity, discharge,  or lesions. Msk:  Symmetrical without gross deformities. Normal posture. Extremities:  Without clubbing or edema. Neurologic:  Alert and  oriented x4;  grossly normal neurologically. Skin:  Intact without significant lesions or rashes. Psych:  Alert and cooperative. Normal mood and affect.  Impression/Plan: 28 y.o. male with developmental delay who is here for follow-up of  hematochezia Proceed with colonoscopy   The risks of the procedure including infection, bleed, or perforation as well as benefits, limitations, alternatives and imponderables have been reviewed with the patient. Questions have been answered. All parties agreeable.

## 2024-09-16 NOTE — Op Note (Signed)
 [facility] Patient Name: Joshua Hurst Procedure Date: 09/16/2024 10:36 AM MRN: [id number] Date of Birth: Apr 11, 1996 Attending MD: Deatrice Dine , MD, [id number] CSN: [id number] Age: 28 Admit Type: Outpatient Procedure:                Colonoscopy Indications:              Hematochezia Providers:                Deatrice Dine, MD, Devere Lodge, Bascom Blush Referring MD:              Medicines:                Monitored Anesthesia Care Complications:            No immediate complications. Estimated Blood Loss:     Estimated blood loss was minimal. Procedure:                Pre-Anesthesia Assessment:                           - Prior to the procedure, a History and Physical                            was performed, and patient medications and                            allergies were reviewed. The patient's tolerance of                            previous anesthesia was also reviewed. The risks                            and benefits of the procedure and the sedation                            options and risks were discussed with the patient.                            All questions were answered, and informed consent                            was obtained. Prior Anticoagulants: The patient has                            taken no anticoagulant or antiplatelet agents. ASA                            Grade Assessment: II - A patient with mild systemic                            disease. After reviewing the risks and benefits,                            the patient was deemed in satisfactory condition to  undergo the procedure.                           After obtaining informed consent, the colonoscope                            was passed under direct vision. Throughout the                            procedure, the patient's blood pressure, pulse, and                            oxygen saturations were monitored continuously. The                             [id number] ([id number]) Colon was introduced through                            the anus and advanced to the the terminal ileum.                            The terminal ileum, ileocecal valve, appendiceal                            orifice, and rectum were photographed. Scope In: 10:59:39 AM Scope Out: 11:15:00 AM Scope Withdrawal Time: 0 hours 10 minutes 44 seconds  Total Procedure Duration: 0 hours 15 minutes 21 seconds  Findings:      The perianal and digital rectal examinations were normal.      There is no endoscopic evidence of inflammation in the entire colon.      An area of mildly erythematous mucosa was found in the rectum. Biopsies       were taken with a cold forceps for histology.      The terminal ileum appeared normal. Impression:               - Erythematous mucosa in the rectum. Biopsied.                           - The examined portion of the ileum was normal. Moderate Sedation:      Per Anesthesia Care Recommendation:           - Patient has a contact number available for                            emergencies. The signs and symptoms of potential                            delayed complications were discussed with the                            patient. Return to normal activities tomorrow.                            Written discharge instructions were provided to the  patient.                           - Resume previous diet.                           - Continue present medications.                           - Await pathology results.                           - Repeat colonoscopy at age 62 for screening                            purposes.                           - Return to GI office as previously scheduled. Procedure Code(s):        --- Professional ---                           [phone], Colonoscopy, flexible; with biopsy, single                            or multiple Diagnosis Code(s):        --- Professional ---                            K62.89, Other specified diseases of anus and rectum                           K92.1, Melena (includes Hematochezia) CPT copyright 2022 American Medical Association. All rights reserved. The codes documented in this report are preliminary and upon coder review may  be revised to meet current compliance requirements. Deatrice Dine, MD Deatrice Dine, MD 09/16/2024 11:21:30 AM This report has been signed electronically. Number of Addenda: 0

## 2024-09-16 NOTE — Anesthesia Preprocedure Evaluation (Signed)
 Anesthesia Evaluation  Patient identified by MRN, date of birth, ID band Patient awake    Reviewed: Allergy & Precautions, H&P , NPO status , Patient's Chart, lab work & pertinent test results, reviewed documented beta blocker date and time   Airway Mallampati: II  TM Distance: >3 FB Neck ROM: full    Dental no notable dental hx.    Pulmonary neg pulmonary ROS   Pulmonary exam normal breath sounds clear to auscultation       Cardiovascular Exercise Tolerance: Good hypertension, negative cardio ROS  Rhythm:regular Rate:Normal     Neuro/Psych  PSYCHIATRIC DISORDERS Anxiety Depression    negative neurological ROS     GI/Hepatic Neg liver ROS,GERD  ,,  Endo/Other  negative endocrine ROS    Renal/GU negative Renal ROS  negative genitourinary   Musculoskeletal   Abdominal   Peds  Hematology negative hematology ROS (+)   Anesthesia Other Findings   Reproductive/Obstetrics negative OB ROS                              Anesthesia Physical Anesthesia Plan  ASA: 2  Anesthesia Plan: MAC   Post-op Pain Management:    Induction:   PONV Risk Score and Plan: Propofol  infusion  Airway Management Planned:   Additional Equipment:   Intra-op Plan:   Post-operative Plan:   Informed Consent: I have reviewed the patients History and Physical, chart, labs and discussed the procedure including the risks, benefits and alternatives for the proposed anesthesia with the patient or authorized representative who has indicated his/her understanding and acceptance.     Dental Advisory Given  Plan Discussed with: CRNA  Anesthesia Plan Comments:         Anesthesia Quick Evaluation

## 2024-09-19 ENCOUNTER — Encounter (HOSPITAL_COMMUNITY): Payer: Self-pay | Admitting: Gastroenterology

## 2024-09-19 ENCOUNTER — Ambulatory Visit (INDEPENDENT_AMBULATORY_CARE_PROVIDER_SITE_OTHER): Payer: Self-pay | Admitting: Gastroenterology

## 2024-09-19 LAB — SURGICAL PATHOLOGY

## 2024-09-20 ENCOUNTER — Encounter (INDEPENDENT_AMBULATORY_CARE_PROVIDER_SITE_OTHER): Payer: Self-pay | Admitting: *Deleted

## 2024-09-22 NOTE — Anesthesia Postprocedure Evaluation (Signed)
 Anesthesia Post Note  Patient: Joshua Hurst  Procedure(s) Performed: COLONOSCOPY  Patient location during evaluation: Phase II Anesthesia Type: MAC Level of consciousness: awake Pain management: pain level controlled Vital Signs Assessment: post-procedure vital signs reviewed and stable Respiratory status: spontaneous breathing and respiratory function stable Cardiovascular status: blood pressure returned to baseline and stable Postop Assessment: no headache and no apparent nausea or vomiting Anesthetic complications: no Comments: Late entry   No notable events documented.   Last Vitals:  Vitals:   09/16/24 1121 09/16/24 1124  BP: (!) 89/40 (!) 107/56  Pulse: 61 60  Resp: 15 14  Temp:    SpO2: 99% 100%    Last Pain:  Vitals:   09/16/24 1124  TempSrc:   PainSc: 3                  Yvonna JINNY Bosworth

## 2024-09-23 NOTE — Progress Notes (Signed)
 Patient result letter mailed Patient's PCP is on EPIC

## 2024-10-24 ENCOUNTER — Encounter (HOSPITAL_COMMUNITY): Payer: Self-pay | Admitting: Registered Nurse

## 2024-10-24 ENCOUNTER — Ambulatory Visit (HOSPITAL_COMMUNITY): Admitting: Registered Nurse

## 2024-10-24 VITALS — BP 113/77 | HR 71 | Ht 72.0 in | Wt 226.4 lb

## 2024-10-24 DIAGNOSIS — F411 Generalized anxiety disorder: Secondary | ICD-10-CM | POA: Diagnosis not present

## 2024-10-24 DIAGNOSIS — F331 Major depressive disorder, recurrent, moderate: Secondary | ICD-10-CM

## 2024-10-24 DIAGNOSIS — F29 Unspecified psychosis not due to a substance or known physiological condition: Secondary | ICD-10-CM | POA: Diagnosis not present

## 2024-10-24 DIAGNOSIS — Z1330 Encounter for screening examination for mental health and behavioral disorders, unspecified: Secondary | ICD-10-CM

## 2024-10-24 MED ORDER — OLANZAPINE 7.5 MG PO TABS: 7.5000 mg | ORAL_TABLET | Freq: Every day | ORAL | 2 refills | Status: AC

## 2024-10-24 MED ORDER — SERTRALINE HCL 50 MG PO TABS: 25.0000 mg | ORAL_TABLET | Freq: Every day | ORAL | 1 refills | Status: AC

## 2024-10-24 NOTE — Patient Instructions (Addendum)
 [facility] Phone:  [phone] Address:  [address] [place], [place] [address] Autism Spectrum Evaluation For a parent or individual dealing with the potential of an autism spectrum diagnosis, the what's next? question looms large. As you plan for an autism spectrum evaluation, look to [facility] in [place] for experienced and compassionate experts to help you navigate the process with confidence.  The truth is who you choose to conduct your autism spectrum evaluation matters. We will provide you with accurate, trustworthy evaluation results and a clear path forward. Who We Evaluate We offer evaluations for anyone over the age of 2 months, including adults. If you are seeing signs in your young child, please do not wait to schedule an evaluation. The earlier children on the autism spectrum are diagnosed, the sooner interventions can begin and the better the long-term outcomes are shown to be. Our Autism Evaluation Process Per the most recent data from the Centers for Disease Control (2020), 1 in 36 children will receive an autism spectrum diagnosis in their lifetime. If your family is navigating this potential reality, know that you are not alone. We perform the Autism Diagnostic Observation Schedule (ADOS), which is the gold standard for an autism spectrum diagnosis. However, though the ADOS is a relatively straightforward test to administer, nuance and experience come into play in the interpretation of its results. That's where our providers truly shine. Their vast experience in this process means each patient receives the most accurate diagnosis possible. Our testing includes Detailed developmental history Cognitive testing ADOS-2 (Autism Diagnostic Observation Schedule) Measure of adaptive functioning (can be interview or questionnaire) Battery of questionnaires: For a caregiver AND at least  one person who sees the individual outside the home Broadband screening measures ASD screeners    [facility] Address: [address], [place], [place] [address] Phone: [phone] Services Provided Within a holistic approach, we offer the following services and clinics: Therapeutic Services (individual, family, and couple therapy) Testing & Secondary School Teacher (IQ, achievement, and disability) Psychological & Developmental Evaluations ADHD, LD & Other School Based Services Autism Spectrum Evaluations & Services Emotionally & Behaviorally Disruptive Treatment Post Traumatic Stress & Trauma Based Services Parenting and Pharmacologist Health & Wellness Services Parent Consultation & Child Advocacy Conflict Resolution & Mediation Services At [employer], we have licensed/licensed eligible psychologists, social workers, and counselors who have extensive experience working with children, adults, and families. Within a comfortable and supportive atmosphere, we offer a highly personalized approach that is tailored to your unique situation and needs. Through our services, we work with children and adolescents (ages 60 through 36) with various emotional, educational, developmental, and behavioral challenges. We also work with adults with various psychological, interpersonal, and life related struggles. As part of our comprehensive assessment process, we will provide you with an objective assessment of your presenting issues. We will then, together, develop a strategy that could resolve and/or fix the problems that led you to contact [facility]. We are proud to announce the opening of our ADHD/LD Clinic AND the Autism Spectrum Clinic. If you suspect that you (or your child) may have a learning disability, an attention deficit/hyperactive disorder, or developmental challenges within the Autism Spectrum , then give us  a call to  schedule an appointment with our Clinic. As part of the evaluation process, you/your child will receive a comprehensive assessment to determine the type/severity of the disorder, an  evaluation of its impact on your cognitive abilities and learning challenges, a consultation with a developmental specialist, and specific recommendations for treatment and services. Together, we will be able to develop a plan to overcome this educational, developmental, and/or behavioral challenge.  Post Traumatic Stress Disorder is a debilitating disorder that can significantly impact a person's daily functioning, relationships, and family dynamics. For some, the trigger can be something seemingly minor; for others, the symptoms can be re-triggered by a reminder of the past trauma; for many, the symptoms don't disappear but remains dormant until the next crisis. Without the appropriate evaluation and therapeutic services, the person could struggle daily in finding or maintaining that elusive emotional balance. If you are struggling with emotional, physical, and/or sexual trauma, do not hesitate to contact us  to help you with this part of your journey.   Apply for Social Security Disability, Application Help [email or web address] Social Security Disability Help This website is for anyone interested in finding out if they are eligible for Social Security Disability or [employer] Income benefits. We provide a Free disability case evaluation service. Disabling Medical Conditions These are a few examples of some medical problems that the Social Security Administration pays disability benefits for: Depression Anxiety Bipolar Disorder Post Traumatic Stress Disorder Vision Problems Lupus Erythmetosus Cancer Panic Disorder and Panic Attacks Back Problems Heart Disease COPD IBS Fibromyalgia Neurological Illness Carpal Tunnel Syndrome Injuries from accidents Multiple sclerosis Traumatic brain  injury Note: This is not a complete list. Many other medical conditions will also be considered by the Social Security Administration. If you are not sure if your medical condition qualifies you for disability benefits, complete the form to get help immediately. There is no charge. This case evaluation service is Free. This site is not connected to, or affiliated with, the [employer]

## 2024-10-24 NOTE — Progress Notes (Signed)
 BH MD/PA/NP OP Progress Note  10/24/2024 11:00 AM Joshua Hurst  MRN:  [id number]  I personally spent a total of 40 minutes in the care of the patient today including preparing to see the patient, getting/reviewing separately obtained history, performing a medically appropriate exam/evaluation, counseling and educating, placing orders, referring and communicating with other health care professionals, documenting clinical information in the EHR, independently interpreting results, and coordinating care in addition to conducting screenings PHQ-9, C-SSRS, GAD-7, AIMS, AUDIT, Nutrition, and Pain, discussing medication options, medication education, and discussing safety referral to behavioral health and completing forms for referral for psychological testing.    Chief Complaint:  Chief Complaint  Patient presents with   Follow-up    Medication management   HPI: Joshua Hurst 29 y.o. male presents today accompanied by his father for medication management follow up.  He was seen face-to-face by this provider and chart reviewed on 10/24/24.  His psychiatric history is significant for Major depression, anxiety, hallucinations.  There is also the possibility of autism spectrum disorder or intellectual developmental delay.  His mental health is currently managed with Zyprexa  5 mg daily at bedtime and Zoloft  25 mg daily.  Joshua Hurst's father is primary historian.  Joshua Hurst is asked if he has noticed any improvement and he moves his hand in a so/so gesture.  He also makes the same gesture when asked about auditory/visual hallucinations.  His father reports that he has noticed a little bit of improvement he hasn't been as agitated, but with all of the holiday stuff going on it's hard to tell.  His father also reports that Joshua Hurst does find when there is no change in regular schedule He likes things to be the same if there is a change he doesn't like it.  His father also reports that there were delays in developmental  milestones.  He states He has never really talked that much instead of saying words are talking he would point.  He reports there was never any testing for autism or IDD during childhood.  He reports he has attempted scheduled appointment for testing at resources that were given during last visit but he has been unsuccessful.  Informed with sending referrals today to [facility] in [place] and [facility] in [place].  Resource information will also be provided on AVS.  Understanding voiced.  Treatment options discussed and father in agreement to increase the medication.  Joshua Hurst shook head no when asked suicidal/self-harm/homicidal ideation.  Joshua Hurst refused to answer the questions for the PHQ-9 and GAD-7.  Screenings completed during today's visit C-SSRS, AIMS, Nutrition, and Pain, see scores below.    His father also reports that he is wanting to apply for disability and how he should go about it.  Informed should first go to Social Security office to get the process started and they would also give recommendations on what other test or documents needed from [employer].  Recommendations: Increase Zoloft  50 mg daily and Zyprexa  7.5 mg daily at bedtime He voiced understanding and agreement with today's plan and recommendations.  Visit Diagnosis:    ICD-10-CM   1. Encounter for screening examination for mental health and behavioral disorders, unspecified  Z13.30 Ambulatory referral to Behavioral Health    2. Moderate episode of recurrent major depressive disorder (HCC)  F33.1 sertraline  (ZOLOFT ) 50 MG tablet    3. GAD (generalized anxiety disorder)  F41.1 sertraline  (ZOLOFT ) 50 MG tablet    4. Psychosis, unspecified psychosis type (HCC)  F29 OLANZapine  (ZYPREXA )  7.5 MG tablet     Past Psychiatric History:  Diagnosis: Major depression, anxiety, hallucinations.  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  Father also reports delay in other developmental  milestones, and problems in school always needing help.  Possibility of an autism spectrum disorder or intellectual developmental disorder Suicide attempt: Denies Non-suicidal self-injurious behavior: Denies Psychiatric hospitalization: Denies Past trauma: Denies Substance abuse: Denies Past psychotropic medication trials: On medications prescribed for Zoloft , Seroquel (never started)  Past Medical History:  Past Medical History:  Diagnosis Date   Acid reflux    Allergy    Spring allergies   Anxiety    Depression    Development delay     Past Surgical History:  Procedure Laterality Date   COLONOSCOPY N/A 09/16/2024   Procedure: COLONOSCOPY;  Surgeon: Cinderella Deatrice FALCON, MD;  [facility];  Service: Endoscopy;  Laterality: N/A;  11:45 am, asa 1-2   ESOPHAGEAL DILATION N/A 04/07/2024   Procedure: DILATION, ESOPHAGUS;  Surgeon: Cinderella Deatrice FALCON, MD;  [facility];  Service: Endoscopy;  Laterality: N/A;   ESOPHAGOGASTRODUODENOSCOPY N/A 04/07/2024   Procedure: EGD (ESOPHAGOGASTRODUODENOSCOPY);  Surgeon: Cinderella Deatrice FALCON, MD;  [facility];  Service: Endoscopy;  Laterality: N/A;  1230pm, asa 1-2, unable to reach pt to move up    Family Psychiatric History: See below family history  Family History:  Family History  Problem Relation Age of Onset   Depression Father    Anxiety disorder Father    Hypertension Father     Social History:  Social History   Socioeconomic History   Marital status: Single    Spouse name: Not on file   Number of children: 0   Years of education: 12 grade   Highest education level: 12th grade  Occupational History   Not on file  Tobacco Use   Smoking status: Never   Smokeless tobacco: Never  Vaping Use   Vaping status: Never Used  Substance and Sexual Activity   Alcohol use: No   Drug use: No   Sexual activity: Never  Other Topics Concern   Not on file  Social History Narrative   Not on file   Social  Drivers of Health   Tobacco Use: Low Risk (10/24/2024)   Patient History    Smoking Tobacco Use: Never    Smokeless Tobacco Use: Never    Passive Exposure: Not on file  Financial Resource Strain: High Risk (03/31/2024)   Overall Financial Resource Strain (CARDIA)    Difficulty of Paying Living Expenses: Very hard  Food Insecurity: No Food Insecurity (03/31/2024)   Epic    Worried About Radiation Protection Practitioner of Food in the Last Year: Never true    Ran Out of Food in the Last Year: Never true  Transportation Needs: No Transportation Needs (03/31/2024)   Epic    Lack of Transportation (Medical): No    Lack of Transportation (Non-Medical): No  Physical Activity: Inactive (03/31/2024)   Exercise Vital Sign    Days of Exercise per Week: 0 days    Minutes of Exercise per Session: Not on file  Stress: Stress Concern Present (03/31/2024)   [employer] of Occupational Health - Occupational Stress Questionnaire    Feeling of Stress: Rather much  Social Connections: Moderately Isolated (03/31/2024)   Social Connection and Isolation Panel    Frequency of Communication with Friends and Family: More than three times a week    Frequency of Social Gatherings with Friends and Family: More than three  times a week    Attends Religious Services: 1 to 4 times per year    Active Member of Clubs or Organizations: No    Attends Banker Meetings: Not on file    Marital Status: Never married  Depression (PHQ2-9): Medium Risk (08/22/2024)   Depression (PHQ2-9)    PHQ-2 Score: 8  Alcohol Screen: Low Risk (08/22/2024)   Alcohol Screen    Last Alcohol Screening Score (AUDIT): 0  Housing: Low Risk (03/31/2024)   Epic    Unable to Pay for Housing in the Last Year: No    Number of Times Moved in the Last Year: 0    Homeless in the Last Year: No  Utilities: Not on file  Health Literacy: Not on file    Allergies: Allergies[1]  Metabolic Disorder Labs: Lab Results  Component Value Date   HGBA1C 4.8  03/04/2024   No results found for: PROLACTIN No results found for: CHOL, TRIG, HDL, CHOLHDL, VLDL, LDLCALC Lab Results  Component Value Date   TSH 1.89 03/04/2024   Current Medications: Current Outpatient Medications  Medication Sig Dispense Refill   cyanocobalamin  (VITAMIN B12) 1000 MCG tablet Take 1 tablet (1,000 mcg total) by mouth daily. 90 tablet 1   pantoprazole  (PROTONIX ) 40 MG tablet TAKE 1 TABLET (40 MG) BY MOUTH DAILY. 30 tablet 2   OLANZapine  (ZYPREXA ) 7.5 MG tablet Take 1 tablet (7.5 mg total) by mouth at bedtime. 90 tablet 2   sertraline  (ZOLOFT ) 50 MG tablet Take 0.5 tablets (25 mg total) by mouth daily. 90 tablet 1   No current facility-administered medications for this visit.     Musculoskeletal: Strength & Muscle Tone: within normal limits Gait & Station: normal Patient leans: N/A  Psychiatric Specialty Exam: Review of Systems  Constitutional:        No other complaints voiced at this time  Psychiatric/Behavioral:  Positive for agitation, dysphoric mood and hallucinations. Negative for sleep disturbance and suicidal ideas. The patient is nervous/anxious.   All other systems reviewed and are negative.   Blood pressure 113/77, pulse 71, height 6' (1.829 m), weight 226 lb 6.4 oz (102.7 kg), SpO2 100%.Body mass index is 30.71 kg/m.  General Appearance: Casual and Disheveled  Eye Contact:  None  Speech:  Slow  Volume:  Decreased  Mood:  Depressed  Affect:  Flat  Thought Process:  Coherent and Linear  Orientation:  Other:  Person  Thought Content: Hallucinations: Auditory Visual   Suicidal Thoughts:  No  Homicidal Thoughts:  No  Memory:  Immediate;   Poor Recent;   Poor Remote;   Poor  Judgement:  Poor  Insight:  Lacking  Psychomotor Activity:  Normal  Concentration:  Concentration: Poor and Attention Span: Poor  Recall:  Poor  Fund of Knowledge: Poor  Language: Fair  Akathisia:  No  Handed:  Right  AIMS (if indicated): done  Assets:   Health And Safety Inspector Housing Leisure Time Physical Health Social Support Transportation  ADL's:  Intact  Cognition: Impaired,  Moderate  Sleep:  Good   Screenings: AIMS    Flowsheet Row Office Visit from 10/24/2024 in [facility] [place] Office Visit from 08/22/2024 in [facility]  AIMS Total Score 0 0   GAD-7    Flowsheet Row Office Visit from 08/22/2024 in [facility] [place] Office Visit from 03/04/2024 in [facility]  Total GAD-7 Score 4 21  PHQ2-9    Flowsheet Row Office Visit from 08/22/2024 in [facility] [place] Office Visit from 03/04/2024 in [facility] Visit from 04/19/2020 in [facility] Office Visit from 11/19/2018 in [facility] Office Visit from 10/13/2018 in [facility]  PHQ-2 Total Score 3 3 0 0 0  PHQ-9 Total Score 8 15 -- -- --   Flowsheet Row Office Visit from 10/24/2024 in [facility] [place] Admission (Discharged) from 09/16/2024 in [facility] Office Visit from 08/22/2024 in [facility] at [place]  C-SSRS RISK CATEGORY No Risk No Risk No Risk     Assessment and Plan:  Assessment: Visit summary: Joshua Hurst appears to be doing fairly well.  His participation in assessment was mostly shaking his head yes/no or hand gestures of so/so.  His father was primary historian who reported there has been no noted adverse reaction to medications and that he has noticed some improvement in mood/irritability.  He reported patient continues to stay in his room most of the time playing video games.  Very little communication.  Patient denied  suicidal/self-harm/homicidal ideation.  A hand gesture of so/so when asked about auditory/visual hallucinations.  Referral for psychological testing made.  Medication assessment completed and adjustments made. During visit Joshua Hurst was dressed appropriately for age and current weather.  He was seated comfortably with no noted distress.  He is oriented to self.  His participation in assessment was linear with no eye contact, and flat affect.  Volume of speech was decreased and slow.  Objectively there was no evidence of psychosis, mania, or delusional thinking.    1. Moderate episode of recurrent major depressive disorder (HCC) - sertraline  (ZOLOFT ) 50 MG tablet; Take 0.5 tablets (25 mg total) by mouth daily.  Dispense: 90 tablet; Refill: 1  2. GAD (generalized anxiety disorder) - sertraline  (ZOLOFT ) 50 MG tablet; Take 0.5 tablets (25 mg total) by mouth daily.  Dispense: 90 tablet; Refill: 1  3. Psychosis, unspecified psychosis type (HCC) - OLANZapine  (ZYPREXA ) 7.5 MG tablet; Take 1 tablet (7.5 mg total) by mouth at bedtime.  Dispense: 90 tablet; Refill: 2  4. Encounter for screening examination for mental health and behavioral disorders, unspecified (Primary) - Ambulatory referral to Behavioral Health     Plan: Medication management: Meds ordered this encounter  Medications   sertraline  (ZOLOFT ) 50 MG tablet    Sig: Take 0.5 tablets (25 mg total) by mouth daily.    Dispense:  90 tablet    Refill:  1    Supervising Provider:   ARFEEN, SYED T [[id number]]   OLANZapine  (ZYPREXA ) 7.5 MG tablet    Sig: Take 1 tablet (7.5 mg total) by mouth at bedtime.    Dispense:  90 tablet    Refill:  2    Supervising Provider:   ARFEEN, SYED T [[id number]]   Medications Discontinued During This Encounter  Medication Reason   OLANZapine  (ZYPREXA ) 5 MG tablet Reorder   sertraline  (ZOLOFT ) 25 MG tablet Reorder    Labs:  Not indicated at this time.     Other:  Counseling/Therapy: Declined services  Joshua Hurst and father informed to call 911, 5, mobile crisis, or present to the nearest emergency room should he experiences any suicidal/homicidal ideation, auditory/visual/hallucinations, or detrimental worsening of his mental health condition.    Joshua Hurst's  participated in the development of this treatment plan and verbalized his understanding/agreement with plan as listed.   Follow Up: Return in 2 months for medication management Call in the interim for any side-effects, decompensation, questions, or problems  Collaboration of Care: Collaboration of Care: Medication Management AEB medication assessment, adjustment, refills and Primary Care Provider AEB referral to behavioral health for psychological testing  Patient/Guardian was advised Release of Information must be obtained prior to any record release in order to collaborate their care with an outside provider. Patient/Guardian was advised if they have not already done so to contact the registration department to sign all necessary forms in order for us  to release information regarding their care.   Consent: Patient/Guardian gives verbal consent for treatment and assignment of benefits for services provided during this visit. Patient/Guardian expressed understanding and agreed to proceed.    Mega Kinkade, NP 10/24/2024, 11:00 AM     [1]  Allergies Allergen Reactions   Omnicef [Cefdinir] Rash   Penicillins Rash

## 2024-11-06 ENCOUNTER — Other Ambulatory Visit (INDEPENDENT_AMBULATORY_CARE_PROVIDER_SITE_OTHER): Payer: Self-pay | Admitting: Gastroenterology

## 2024-11-06 DIAGNOSIS — K219 Gastro-esophageal reflux disease without esophagitis: Secondary | ICD-10-CM

## 2024-11-06 DIAGNOSIS — R1319 Other dysphagia: Secondary | ICD-10-CM

## 2024-12-05 ENCOUNTER — Ambulatory Visit (HOSPITAL_COMMUNITY): Admitting: Registered Nurse

## 2025-03-10 ENCOUNTER — Ambulatory Visit: Admitting: Student in an Organized Health Care Education/Training Program
